# Patient Record
Sex: Male | Born: 1965
Health system: Southern US, Community
[De-identification: ages and names within clinical notes are randomized; demographics above are authoritative.]

## PROBLEM LIST (undated history)

## (undated) DIAGNOSIS — R569 Unspecified convulsions: Secondary | ICD-10-CM

## (undated) DIAGNOSIS — T7840XA Allergy, unspecified, initial encounter: Secondary | ICD-10-CM

## (undated) DIAGNOSIS — E079 Disorder of thyroid, unspecified: Secondary | ICD-10-CM

## (undated) DIAGNOSIS — Z21 Asymptomatic human immunodeficiency virus [HIV] infection status: Secondary | ICD-10-CM

## (undated) DIAGNOSIS — B20 Human immunodeficiency virus [HIV] disease: Secondary | ICD-10-CM

## (undated) DIAGNOSIS — R55 Syncope and collapse: Secondary | ICD-10-CM

## (undated) DIAGNOSIS — B019 Varicella without complication: Secondary | ICD-10-CM

## (undated) HISTORY — DX: Disorder of thyroid, unspecified: E07.9

## (undated) HISTORY — DX: Varicella without complication: B01.9

## (undated) HISTORY — PX: NO PAST SURGERIES: SHX2092

## (undated) HISTORY — DX: Allergy, unspecified, initial encounter: T78.40XA

---

## 2017-08-21 ENCOUNTER — Emergency Department (HOSPITAL_COMMUNITY): Payer: BC Managed Care – PPO

## 2017-08-21 ENCOUNTER — Observation Stay (HOSPITAL_COMMUNITY)
Admission: EM | Admit: 2017-08-21 | Discharge: 2017-08-24 | Disposition: A | Discharge status: Home / Self Care | Payer: BC Managed Care – PPO | Attending: Family Medicine | Admitting: Family Medicine

## 2017-08-21 ENCOUNTER — Encounter (HOSPITAL_COMMUNITY): Payer: Self-pay | Admitting: *Deleted

## 2017-08-21 ENCOUNTER — Other Ambulatory Visit: Payer: Self-pay

## 2017-08-21 DIAGNOSIS — R55 Syncope and collapse: Secondary | ICD-10-CM | POA: Diagnosis not present

## 2017-08-21 DIAGNOSIS — R779 Abnormality of plasma protein, unspecified: Secondary | ICD-10-CM | POA: Diagnosis not present

## 2017-08-21 DIAGNOSIS — D509 Iron deficiency anemia, unspecified: Secondary | ICD-10-CM | POA: Insufficient documentation

## 2017-08-21 DIAGNOSIS — E860 Dehydration: Secondary | ICD-10-CM | POA: Insufficient documentation

## 2017-08-21 DIAGNOSIS — X58XXXA Exposure to other specified factors, initial encounter: Secondary | ICD-10-CM | POA: Insufficient documentation

## 2017-08-21 DIAGNOSIS — R319 Hematuria, unspecified: Secondary | ICD-10-CM | POA: Insufficient documentation

## 2017-08-21 DIAGNOSIS — E038 Other specified hypothyroidism: Secondary | ICD-10-CM | POA: Insufficient documentation

## 2017-08-21 DIAGNOSIS — R32 Unspecified urinary incontinence: Secondary | ICD-10-CM | POA: Diagnosis not present

## 2017-08-21 DIAGNOSIS — D696 Thrombocytopenia, unspecified: Secondary | ICD-10-CM | POA: Diagnosis not present

## 2017-08-21 DIAGNOSIS — T675XXA Heat exhaustion, unspecified, initial encounter: Secondary | ICD-10-CM | POA: Diagnosis not present

## 2017-08-21 DIAGNOSIS — J069 Acute upper respiratory infection, unspecified: Secondary | ICD-10-CM | POA: Insufficient documentation

## 2017-08-21 DIAGNOSIS — I088 Other rheumatic multiple valve diseases: Secondary | ICD-10-CM | POA: Insufficient documentation

## 2017-08-21 DIAGNOSIS — R7989 Other specified abnormal findings of blood chemistry: Secondary | ICD-10-CM | POA: Insufficient documentation

## 2017-08-21 DIAGNOSIS — D61818 Other pancytopenia: Secondary | ICD-10-CM | POA: Insufficient documentation

## 2017-08-21 HISTORY — DX: Syncope and collapse: R55

## 2017-08-21 HISTORY — DX: Unspecified convulsions: R56.9

## 2017-08-21 LAB — CBC WITH DIFFERENTIAL/PLATELET
Basophils Absolute: 0 10*3/uL (ref 0.0–0.1)
Basophils Relative: 0 %
EOS PCT: 1 %
Eosinophils Absolute: 0.1 10*3/uL (ref 0.0–0.7)
HCT: 33.8 % — ABNORMAL LOW (ref 39.0–52.0)
HEMOGLOBIN: 11 g/dL — AB (ref 13.0–17.0)
LYMPHS PCT: 4 %
Lymphs Abs: 0.5 10*3/uL — ABNORMAL LOW (ref 0.7–4.0)
MCH: 24.9 pg — ABNORMAL LOW (ref 26.0–34.0)
MCHC: 32.5 g/dL (ref 30.0–36.0)
MCV: 76.5 fL — AB (ref 78.0–100.0)
MONO ABS: 0.5 10*3/uL (ref 0.1–1.0)
Monocytes Relative: 4 %
NEUTROS PCT: 91 %
Neutro Abs: 12 10*3/uL — ABNORMAL HIGH (ref 1.7–7.7)
PLATELETS: 82 10*3/uL — AB (ref 150–400)
RBC: 4.42 MIL/uL (ref 4.22–5.81)
RDW: 13.6 % (ref 11.5–15.5)
WBC: 13.1 10*3/uL — AB (ref 4.0–10.5)

## 2017-08-21 LAB — I-STAT TROPONIN, ED: Troponin i, poc: 0 ng/mL (ref 0.00–0.08)

## 2017-08-21 LAB — COMPREHENSIVE METABOLIC PANEL
ALT: 12 U/L — ABNORMAL LOW (ref 17–63)
ANION GAP: 7 (ref 5–15)
AST: 19 U/L (ref 15–41)
Albumin: 2.8 g/dL — ABNORMAL LOW (ref 3.5–5.0)
Alkaline Phosphatase: 40 U/L (ref 38–126)
BUN: 11 mg/dL (ref 6–20)
CHLORIDE: 105 mmol/L (ref 101–111)
CO2: 26 mmol/L (ref 22–32)
CREATININE: 1.41 mg/dL — AB (ref 0.61–1.24)
Calcium: 8.6 mg/dL — ABNORMAL LOW (ref 8.9–10.3)
GFR, EST NON AFRICAN AMERICAN: 57 mL/min — AB (ref >60)
Glucose, Bld: 108 mg/dL — ABNORMAL HIGH (ref 65–99)
Potassium: 3.7 mmol/L (ref 3.5–5.1)
SODIUM: 138 mmol/L (ref 135–145)
Total Bilirubin: 0.4 mg/dL (ref 0.3–1.2)
Total Protein: 8.3 g/dL — ABNORMAL HIGH (ref 6.5–8.1)

## 2017-08-21 LAB — URINALYSIS, ROUTINE W REFLEX MICROSCOPIC
Bilirubin Urine: NEGATIVE
GLUCOSE, UA: NEGATIVE mg/dL
Ketones, ur: NEGATIVE mg/dL
LEUKOCYTES UA: NEGATIVE
Nitrite: NEGATIVE
PROTEIN: 100 mg/dL — AB
SPECIFIC GRAVITY, URINE: 1.018 (ref 1.005–1.030)
pH: 5 (ref 5.0–8.0)

## 2017-08-21 LAB — LIPID PANEL
CHOL/HDL RATIO: 3.1 ratio
CHOLESTEROL: 92 mg/dL (ref 0–200)
HDL: 30 mg/dL — AB (ref >40)
LDL CALC: 51 mg/dL (ref 0–99)
TRIGLYCERIDES: 55 mg/dL (ref <150)
VLDL: 11 mg/dL (ref 0–40)

## 2017-08-21 LAB — RAPID URINE DRUG SCREEN, HOSP PERFORMED
AMPHETAMINES: NOT DETECTED
BENZODIAZEPINES: NOT DETECTED
Barbiturates: NOT DETECTED
Cocaine: NOT DETECTED
OPIATES: NOT DETECTED
Tetrahydrocannabinol: NOT DETECTED

## 2017-08-21 LAB — CREATININE, SERUM
Creatinine, Ser: 1.44 mg/dL — ABNORMAL HIGH (ref 0.61–1.24)
GFR calc Af Amer: >60 mL/min (ref >60)
GFR calc non Af Amer: 55 mL/min — ABNORMAL LOW (ref >60)

## 2017-08-21 LAB — IRON AND TIBC
Iron: 20 ug/dL — ABNORMAL LOW (ref 45–182)
SATURATION RATIOS: 8 % — AB (ref 17.9–39.5)
TIBC: 251 ug/dL (ref 250–450)
UIBC: 231 ug/dL

## 2017-08-21 LAB — PROCALCITONIN: Procalcitonin: 0.42 ng/mL

## 2017-08-21 LAB — TRANSFERRIN: TRANSFERRIN: 179 mg/dL — AB (ref 180–329)

## 2017-08-21 LAB — TSH: TSH: 0.254 u[IU]/mL — AB (ref 0.350–4.500)

## 2017-08-21 LAB — T4, FREE: Free T4: 0.69 ng/dL — ABNORMAL LOW (ref 0.82–1.77)

## 2017-08-21 LAB — CBG MONITORING, ED: GLUCOSE-CAPILLARY: 96 mg/dL (ref 65–99)

## 2017-08-21 LAB — MAGNESIUM: Magnesium: 1.5 mg/dL — ABNORMAL LOW (ref 1.7–2.4)

## 2017-08-21 LAB — TROPONIN I: TROPONIN I: 0.06 ng/mL — AB (ref <0.03)

## 2017-08-21 LAB — FERRITIN: Ferritin: 128 ng/mL (ref 24–336)

## 2017-08-21 LAB — D-DIMER, QUANTITATIVE (NOT AT ARMC): D DIMER QUANT: 3.1 ug{FEU}/mL — AB (ref 0.00–0.50)

## 2017-08-21 MED ORDER — ONDANSETRON HCL 4 MG/2ML IJ SOLN
4.0000 mg | Freq: Once | INTRAMUSCULAR | Status: AC
  Administered 2017-08-21: 4 mg via INTRAVENOUS
  Filled 2017-08-21: qty 2

## 2017-08-21 MED ORDER — SODIUM CHLORIDE 0.9 % IV BOLUS
1000.0000 mL | Freq: Once | INTRAVENOUS | Status: AC
  Administered 2017-08-21: 1000 mL via INTRAVENOUS

## 2017-08-21 MED ORDER — IOPAMIDOL (ISOVUE-370) INJECTION 76%
100.0000 mL | Freq: Once | INTRAVENOUS | Status: AC | PRN
  Administered 2017-08-21: 100 mL via INTRAVENOUS

## 2017-08-21 MED ORDER — IOPAMIDOL (ISOVUE-370) INJECTION 76%
INTRAVENOUS | Status: AC
  Filled 2017-08-21: qty 100

## 2017-08-21 MED ORDER — ACETAMINOPHEN 650 MG RE SUPP: 650.0000 mg | Freq: Four times a day (QID) | RECTAL | Status: DC | PRN

## 2017-08-21 MED ORDER — LEVOFLOXACIN 750 MG PO TABS
750.0000 mg | ORAL_TABLET | ORAL | Status: DC
  Administered 2017-08-21 – 2017-08-23 (×3): 750 mg via ORAL
  Filled 2017-08-21 (×3): qty 1

## 2017-08-21 MED ORDER — MAGNESIUM SULFATE 2 GM/50ML IV SOLN
2.0000 g | Freq: Once | INTRAVENOUS | Status: AC
  Administered 2017-08-21: 2 g via INTRAVENOUS
  Filled 2017-08-21: qty 50

## 2017-08-21 MED ORDER — ACETAMINOPHEN 325 MG PO TABS: 650.0000 mg | ORAL_TABLET | Freq: Four times a day (QID) | ORAL | Status: DC | PRN

## 2017-08-21 MED ORDER — ONDANSETRON HCL 4 MG PO TABS: 4.0000 mg | ORAL_TABLET | Freq: Three times a day (TID) | ORAL | Status: DC | PRN

## 2017-08-21 NOTE — H&P (Addendum)
[facility] Admission History and Physical Service Pager: [phone]  Patient name: Hunter Graves Medical record number: [id number] Date of birth: 01-07-1966 Age: 52 y.o. Gender: male  Primary Care Provider: Patient, No Pcp Per Consultants: neuro Code Status: full  Chief Complaint: syncope  Assessment and Plan: Hunter Graves is a 52 y.o. male presenting with syncope. PMH is significant for 1x potential seizure as a teenager with very little medical treatment throughout life  Syncope- infectious/vasovagal vs seizure vs arrhythmia. Prodrome favors  vasovagal versus cardiogenic.  He also have positive orthostatic vitals in ED.  Full syncope at [facility], had incontinence, question of report of convulsions but no reported post-ictal.  Patient does have hx of 1 potential seizure as a teen but was never started on meds so true seizure is not confirmed.  Positive orthostatic vitals in ED.  Patient has been coughing for ~2wk with occasional vomiting during cough.  1x blood tinged sputum reported today, no blood in vomit.  No palpitation, chest pain, SOB reported.  Patient did have prodrome prior to syncope w/ sudden feeling of light headedness/hot flash/nausea.  CXR was read as neg but there were crackles on exam.  CTA neg for PE.   Thrombocytopenic but Head CT neg for acute bleed.  Slightly anemic to 11 but unlikely enough to cause syncope.  Microcytic. S/P 1L.   -Admit to tele, Dr. Mingo Amber -consider neuro consult in AM -levoquin (5/28- ) -EEG -ECHO -am ECG -procalcitonin -TSH -lipid panel -HIV -will add O2 supp if needed but patient stable on RA currently  -UDS -He may need an event monitor before returning to his work.  He is a school bus driver  Cough: patient with productive cough for about 3 weeks.  Lung exam with crackles over right lower lobe.  He also have mild leukocytosis with left shift. Although there is no infiltration on chest x-ray/CTA chest, I think  it is reasonable to cover him for possible CAP. -We will cover him with Levaquin -Check procalcitonin  LVH- 1/6 systolic murmur- LVH on ECG, murmur on exam (neither prior known to patient).  istat trop neg in ED. -ECHO -am ECG -will consider cardiology consult based on results of further workup -trop x1  Microcytic anemia/thrombocytopenia -ferritin/iron/TIBC/transferrin -cbc in AM  Proteinemia with protein gap.  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  UA without nitrites or leukocytes -Repeat UA -May need outpatient urology follow-up if other work-ups are unrevealing  Elevated serum creatinine: unclear if this is AKI or CKD without prior serum creatinine.  Status post normal saline bolus in ED -Continue offering oral fluid -Repeat BMP  Hypomagnesemia: Mag 1.5 in ED. status post 2g -Recheck Mag   Pain: patient is currently pain-free -prn tylenol  FEN/GI: heart diet (after RN bedside), zofran  Prophylaxis: SCDs  Disposition: to tele for further workup  History of Present Illness:  Hunter Graves is a 51 y.o. male presenting with full syncope at [facility] about 10 am this morning.  He was standing up at the drink counter and suddenly felt flush/lightheaded/ and nauseous. He walked to a table and sat down.  He then woke up on the floor with people around him.  He had urinary incontinence.  There is a question of report of convulsions but no reported post-ictal.  Patient does have hx of 1 potential seizure as a teen but was never started on meds  so true seizure is not confirmed.  Patient has been coughing for ~2wk with occasional vomiting during cough.  Cough was productive with yellowish and greenish phlegm.  He says his cough was worse this morning and he noticed a blood tinge. Denies hematemesis.  Denies cold-like symptoms.  Denies fever.  Denies palpitation, chest  pain or SOB has been reported.  Denies headache, vision change, focal numbness/tingling/weakness or speech change.  He denies unintentional weight loss.  He has not been seeing a doctor regularly for most of his life.  He has no known medical conditions and excpet for an occasional tylenol does not have any chronic meds.  He does not smoke or use street drugs.  He drinks <5 alcoholic drinks per month.  He is a school bus driver.  He lives alone.  He is not aware of any medical condition that runs in the family.  Review Of Systems: Per HPI with the following additions:   Review of Systems  Constitutional: Positive for fever and weight loss.  HENT: Negative for congestion and sinus pain.   Eyes: Negative for blurred vision.  Respiratory: Positive for cough and sputum production. Negative for hemoptysis, shortness of breath and wheezing.   Cardiovascular: Negative for chest pain, palpitations and orthopnea.  Gastrointestinal: Negative for blood in stool, constipation and diarrhea.  Genitourinary: Negative for dysuria and urgency.  Neurological: Positive for seizures and loss of consciousness.  Psychiatric/Behavioral: Negative for depression.   Patient Active Problem List   Diagnosis Date Noted  . Syncope 08/21/2017    Past Medical History: History reviewed. No pertinent past medical history.  Past Surgical History: History reviewed. No pertinent surgical history.  Social History: Social History   Tobacco Use  . Smoking status: Never Smoker  . Smokeless tobacco: Never Used  Substance Use Topics  . Alcohol use: Yes    Comment: social  . Drug use: Never   Additional social history:   Please also refer to relevant sections of EMR.  Family History: History reviewed. No pertinent family history. Patient did not claim relevant family history  Allergies and Medications: No Known Allergies No current facility-administered medications on file prior to encounter.    Current  Outpatient Medications on File Prior to Encounter  Medication Sig Dispense Refill  . Dextromethorphan-guaiFENesin (ROBITUSSIN DM PO) Take 30 mLs by mouth as needed.    Marland Kitchen guaiFENesin (MUCINEX) 600 MG 12 hr tablet Take 600 mg by mouth 2 (two) times daily.      Objective: BP (!) 139/91   Pulse 98   Resp (!) 23   Ht 6' (1.829 m)   Wt 144 lb (65.3 kg)   SpO2 98%   BMI 19.53 kg/m  Exam: General: thin-looking male, NAD, on room air Eyes: clear sclera, EOMI, PERRL ENTM: MMM, no rhinorhea, no sputum production during exam, no exudates on tonsils Neck: no LAD, FROM Cardiovascular: RRR, 1/6 murmur, no edema Respiratory: crackles on lower lungs bilaterally, right > left, no IWB, good air movement Gastrointestinal: soft belly, no TTP MSK: no deficits noted Derm: no rashes noted Neuro: CN exam without deficit noted. Awake, alert and oriented appropriately. Cranial nerves II-XII  intact, motor 5/5 in all muscle groups of UE and LE bilaterally, normal tone, light sensation intact in all dermatomes of upper and lower ext bilaterally, no pronator drift, biceps and patellar reflexes 2+ bilaterally, finger to nose intact, gait deferred. Speech with mild ?expressive aphasia but he says this is his baseline Psych: AOx3 appropriate thought process.  Labs and Imaging: CBC BMET  Recent Labs  Lab 08/21/17 1250  WBC 13.1*  HGB 11.0*  HCT 33.8*  PLT 82*   Recent Labs  Lab 08/21/17 1250  NA 138  K 3.7  CL 105  CO2 26  BUN 11  CREATININE 1.41*  GLUCOSE 108*  CALCIUM 8.6*     Dg Chest 2 View  Result Date: 08/21/2017 CLINICAL DATA:  Syncopal episode and possible seizure this morning. Patient reports productive cough for the past 3-4 weeks. No current symptoms. EXAM: CHEST - 2 VIEW COMPARISON:  None in PACs FINDINGS: The lungs are adequately inflated and clear. The heart and mediastinal structures are normal. The trachea is midline. There is no pleural effusion. The bony thorax exhibits no acute  abnormality. IMPRESSION: There is no pneumonia nor other acute cardiopulmonary abnormality. Electronically Signed   By: David  Martinique M.D.   On: 08/21/2017 13:13   Ct Head Wo Contrast  Result Date: 08/21/2017 CLINICAL DATA:  Syncopal episode EXAM: CT HEAD WITHOUT CONTRAST TECHNIQUE: Contiguous axial images were obtained from the base of the skull through the vertex without intravenous contrast. COMPARISON:  None. FINDINGS: Brain: No evidence of acute infarction, hemorrhage, hydrocephalus, extra-axial collection or mass lesion/mass effect. Vascular: No hyperdense vessel or unexpected calcification. Skull: Normal. Negative for fracture or focal lesion. Sinuses/Orbits: Opacification of the maxillary sinuses is noted with partial opacification of the sphenoid sinus on the right and mucosal thickening in the ethmoid sinuses bilaterally. Other: None. IMPRESSION: No acute intracranial abnormality is noted. Diffuse sinus disease of uncertain chronicity. Electronically Signed   By: Inez Catalina M.D.   On: 08/21/2017 13:39   Ct Angio Chest Pe W/cm &/or Wo Cm  Result Date: 08/21/2017 CLINICAL DATA:  Syncopal episode today EXAM: CT ANGIOGRAPHY CHEST WITH CONTRAST TECHNIQUE: Multidetector CT imaging of the chest was performed using the standard protocol during bolus administration of intravenous contrast. Multiplanar CT image reconstructions and MIPs were obtained to evaluate the vascular anatomy. CONTRAST:  75 cc Isovue 370 COMPARISON:  Chest radiography same day FINDINGS: Cardiovascular: Pulmonary arterial opacification is good. No pulmonary emboli. No aortic atherosclerosis. No coronary artery calcification. No cardiomegaly. No pericardial fluid. Mediastinum/Nodes: No mass or lymphadenopathy Lungs/Pleura: Bronchial thickening pattern centrally and in the lower lobes that could go along with bronchitis or asthma. Upper lungs are clear. No consolidation or collapse. No pleural effusion. Upper Abdomen: Negative  Musculoskeletal: Negative Review of the MIP images confirms the above findings. IMPRESSION: No pulmonary emboli. Pattern of bronchial thickening in the perihilar regions and lower lobes that could go along with bronchitis or asthma. Electronically Signed   By: Nelson Chimes M.D.   On: 08/21/2017 16:19    Sherene Sires, DO 08/21/2017, 6:43 PM PGY-1, [place] Intern pager: [phone], text pages welcome   I have seen and evaluated the patient with Dr. Criss Rosales. I am in agreement with the note above in its revised form. My additions are in red.  Wendee Beavers, MD, PGY-3 08/21/2017 11:28 PM

## 2017-08-21 NOTE — ED Notes (Signed)
Pt returned from XR/CT scans

## 2017-08-21 NOTE — ED Provider Notes (Signed)
[facility] EMERGENCY DEPARTMENT Provider Note   CSN: [id number] Arrival date & time: 08/21/17  1053     History   Chief Complaint Chief Complaint  Patient presents with  . Loss of Consciousness    HPI MOUA RASMUSSON is a 52 y.o. male.  The history is provided by the patient. No language interpreter was used.  Loss of Consciousness     RIGLEY NIESS is a 52 y.o. male who presents to the Emergency Department complaining of syncope. His waiting in line at [facility] and had ordered his food when he began to feel nauseous. He went to sit down and then he sync up ice and fell to the ground. He did hit his face. It is unclear if he had seizure activity or not. The fall was unwitnessed and he states that the clerk told him that he did have convulsions. He states when he awoke he was aware of his surroundings but did have nausea with vomiting once. He has persistent nausea in the emergency department. He does endorse 3 to 4 weeks of cough, worse at night. He also endorses night sweats. No reports of fever, chest pain, diarrhea, leg swelling or pain. He had one seizure when he was a teenager but no additional seizures. He was not placed on seizure medications. History reviewed. No pertinent past medical history.  Patient Active Problem List   Diagnosis Date Noted  . Syncope 08/21/2017    History reviewed. No pertinent surgical history.      Home Medications    Prior to Admission medications   Medication Sig Start Date End Date Taking? Authorizing Provider  Dextromethorphan-guaiFENesin (ROBITUSSIN DM PO) Take 30 mLs by mouth as needed.   Yes [provider]  guaiFENesin (MUCINEX) 600 MG 12 hr tablet Take 600 mg by mouth 2 (two) times daily.   Yes [provider]    Family History History reviewed. No pertinent family history.  Social History Social History   Tobacco Use  . Smoking status: Never Smoker  . Smokeless tobacco: Never Used    Substance Use Topics  . Alcohol use: Yes    Comment: social  . Drug use: Never     Allergies   Patient has no known allergies.   Review of Systems Review of Systems  Cardiovascular: Positive for syncope.  All other systems reviewed and are negative.    Physical Exam Updated Vital Signs BP 134/84   Pulse 99   Resp 16   Ht 6' (1.829 m)   Wt 65.3 kg (144 lb)   SpO2 97%   BMI 19.53 kg/m   Physical Exam  Constitutional: He is oriented to person, place, and time. He appears well-developed and well-nourished.  HENT:  Head: Normocephalic and atraumatic.  Eyes: Pupils are equal, round, and reactive to light. EOM are normal.  Cardiovascular: Normal rate and regular rhythm.  No murmur heard. Pulmonary/Chest: Effort normal and breath sounds normal. No respiratory distress.  Abdominal: Soft. There is no tenderness. There is no rebound and no guarding.  Musculoskeletal: He exhibits no edema or tenderness.  Neurological: He is alert and oriented to person, place, and time.  Skin: Skin is warm and dry.  Psychiatric: He has a normal mood and affect. His behavior is normal.  Nursing note and vitals reviewed.    ED Treatments / Results  Labs (all labs ordered are listed, but only abnormal results are displayed) Labs Reviewed  COMPREHENSIVE METABOLIC PANEL - Abnormal; Notable for  the following components:      Result Value   Glucose, Bld 108 (*)    Creatinine, Ser 1.41 (*)    Calcium 8.6 (*)    Total Protein 8.3 (*)    Albumin 2.8 (*)    ALT 12 (*)    GFR calc non Af Amer 57 (*)    All other components within normal limits  CBC WITH DIFFERENTIAL/PLATELET - Abnormal; Notable for the following components:   WBC 13.1 (*)    Hemoglobin 11.0 (*)    HCT 33.8 (*)    MCV 76.5 (*)    MCH 24.9 (*)    Platelets 82 (*)    Neutro Abs 12.0 (*)    Lymphs Abs 0.5 (*)    All other components within normal limits  URINALYSIS, ROUTINE W REFLEX MICROSCOPIC - Abnormal; Notable for the  following components:   APPearance HAZY (*)    Hgb urine dipstick LARGE (*)    Protein, ur 100 (*)    RBC / HPF >50 (*)    Bacteria, UA RARE (*)    All other components within normal limits  MAGNESIUM - Abnormal; Notable for the following components:   Magnesium 1.5 (*)    All other components within normal limits  D-DIMER, QUANTITATIVE (NOT AT [facility]) - Abnormal; Notable for the following components:   D-Dimer, Quant 3.10 (*)    All other components within normal limits  URINE CULTURE  RAPID URINE DRUG SCREEN, HOSP PERFORMED  CBG MONITORING, ED  I-STAT TROPONIN, ED    EKG EKG Interpretation  Date/Time:  Tuesday Aug 21 2017 11:30:56 EDT Ventricular Rate:  106 PR Interval:    QRS Duration: 72 QT Interval:  330 QTC Calculation: 439 R Axis:   59 Text Interpretation:  Sinus tachycardia Ventricular premature complex Left ventricular hypertrophy Anterior Q waves, possibly due to LVH no prior available for comparison Confirmed by Quintella Reichert [phone]) on 08/21/2017 11:35:50 AM   Radiology Dg Chest 2 View  Result Date: 08/21/2017 CLINICAL DATA:  Syncopal episode and possible seizure this morning. Patient reports productive cough for the past 3-4 weeks. No current symptoms. EXAM: CHEST - 2 VIEW COMPARISON:  None in PACs FINDINGS: The lungs are adequately inflated and clear. The heart and mediastinal structures are normal. The trachea is midline. There is no pleural effusion. The bony thorax exhibits no acute abnormality. IMPRESSION: There is no pneumonia nor other acute cardiopulmonary abnormality. Electronically Signed   By: David  Martinique M.D.   On: 08/21/2017 13:13   Ct Head Wo Contrast  Result Date: 08/21/2017 CLINICAL DATA:  Syncopal episode EXAM: CT HEAD WITHOUT CONTRAST TECHNIQUE: Contiguous axial images were obtained from the base of the skull through the vertex without intravenous contrast. COMPARISON:  None. FINDINGS: Brain: No evidence of acute infarction, hemorrhage,  hydrocephalus, extra-axial collection or mass lesion/mass effect. Vascular: No hyperdense vessel or unexpected calcification. Skull: Normal. Negative for fracture or focal lesion. Sinuses/Orbits: Opacification of the maxillary sinuses is noted with partial opacification of the sphenoid sinus on the right and mucosal thickening in the ethmoid sinuses bilaterally. Other: None. IMPRESSION: No acute intracranial abnormality is noted. Diffuse sinus disease of uncertain chronicity. Electronically Signed   By: Inez Catalina M.D.   On: 08/21/2017 13:39   Ct Angio Chest Pe W/cm &/or Wo Cm  Result Date: 08/21/2017 CLINICAL DATA:  Syncopal episode today EXAM: CT ANGIOGRAPHY CHEST WITH CONTRAST TECHNIQUE: Multidetector CT imaging of the chest was performed using the standard protocol during bolus  administration of intravenous contrast. Multiplanar CT image reconstructions and MIPs were obtained to evaluate the vascular anatomy. CONTRAST:  75 cc Isovue 370 COMPARISON:  Chest radiography same day FINDINGS: Cardiovascular: Pulmonary arterial opacification is good. No pulmonary emboli. No aortic atherosclerosis. No coronary artery calcification. No cardiomegaly. No pericardial fluid. Mediastinum/Nodes: No mass or lymphadenopathy Lungs/Pleura: Bronchial thickening pattern centrally and in the lower lobes that could go along with bronchitis or asthma. Upper lungs are clear. No consolidation or collapse. No pleural effusion. Upper Abdomen: Negative Musculoskeletal: Negative Review of the MIP images confirms the above findings. IMPRESSION: No pulmonary emboli. Pattern of bronchial thickening in the perihilar regions and lower lobes that could go along with bronchitis or asthma. Electronically Signed   By: Nelson Chimes M.D.   On: 08/21/2017 16:19    Procedures Procedures (including critical care time)  Medications Ordered in ED Medications  iopamidol (ISOVUE-370) 76 % injection (has no administration in time range)  sodium  chloride 0.9 % bolus 1,000 mL (0 mLs Intravenous Stopped 08/21/17 1408)  ondansetron (ZOFRAN) injection 4 mg (4 mg Intravenous Given 08/21/17 1239)  magnesium sulfate IVPB 2 g 50 mL (0 g Intravenous Stopped 08/21/17 1508)  iopamidol (ISOVUE-370) 76 % injection 100 mL (100 mLs Intravenous Contrast Given 08/21/17 1603)     Initial Impression / Assessment and Plan / ED Course  I have reviewed the triage vital signs and the nursing notes.  Pertinent labs & imaging results that were available during my care of the patient were reviewed by me and considered in my medical decision making (see chart for details).     Patient here for evaluation following syncopal event with possible seizure activity. He did have nausea and tachycardia on ED arrival, these improved with IV fluids and antiemetics. Given recent URI with cough and congestion, tachycardia and possible syncope at the timer was ordered and this was elevated. CT was obtained and this is negative for PE. CT had is negative for any mass lesions. Labs do demonstrate elevation and creatinine with thrombocytopenia, unclear if this is acute or chronic. No evidence of acute infectious process based on evaluation at this time. Patient works as a Recruitment consultant. Given unclear events, syncope versus seizure recommend observation admission for further monitoring. Discussed with patient findings of studies recommendation for admission and he is in agreement with plan. Family medicine consulted for admission. Final Clinical Impressions(s) / ED Diagnoses   Final diagnoses:  Syncope and collapse    ED Discharge Orders    None       Quintella Reichert, MD 08/21/17 1722

## 2017-08-21 NOTE — ED Notes (Signed)
Patient transported to X-ray 

## 2017-08-21 NOTE — ED Notes (Signed)
Pt notified he has to provide a urine specimen when possible. Pt was given the option of condom catheter or urinal, and Pt opted to try urinal at this time.

## 2017-08-21 NOTE — ED Triage Notes (Signed)
Pt passed out while at [employer] today. Employee of sub way heard Pt fall to ground. PT reports he was sitting in a chair before he passed out. Pt reports he remembers waking up on the floor with a group of people around him. Pt reports seizure many year ago . Pt also reports a cough for a week. Pt observed to have urinary incont. With syncope.

## 2017-08-21 NOTE — Progress Notes (Signed)
Received report on pt.

## 2017-08-21 NOTE — Progress Notes (Signed)
Hunter Graves is a 52 y.o. male patient admitted from ED awake, alert - oriented  X 4 - no acute distress noted.  VSS - Blood pressure (!) 139/91, pulse 98, resp. rate (!) 23, height 6' (1.829 m), weight 65.3 kg (144 lb), SpO2 98 %.    IV in place, occlusive dsg intact without redness.  Orientation to room, and floor completed with information packet given to patient/family.  Patient declined safety video at this time.  Admission INP armband ID verified with patient/family, and in place.   SR up x 2, fall assessment complete, with patient and family able to verbalize understanding of risk associated with falls, and verbalized understanding to call nsg before up out of bed.  Call light within reach, patient able to voice, and demonstrate understanding.  Skin, clean-dry- intact without evidence of bruising, or skin tears.   No evidence of skin break down noted on exam.     Will cont to eval and treat per MD orders.  Celine Ahr, RN 08/21/2017 6:47 PM

## 2017-08-22 ENCOUNTER — Observation Stay (HOSPITAL_COMMUNITY): Payer: BC Managed Care – PPO

## 2017-08-22 DIAGNOSIS — R55 Syncope and collapse: Secondary | ICD-10-CM | POA: Diagnosis not present

## 2017-08-22 DIAGNOSIS — E038 Other specified hypothyroidism: Secondary | ICD-10-CM | POA: Diagnosis not present

## 2017-08-22 DIAGNOSIS — T675XXA Heat exhaustion, unspecified, initial encounter: Secondary | ICD-10-CM | POA: Diagnosis not present

## 2017-08-22 DIAGNOSIS — D61818 Other pancytopenia: Secondary | ICD-10-CM | POA: Diagnosis not present

## 2017-08-22 LAB — BASIC METABOLIC PANEL
ANION GAP: 4 — AB (ref 5–15)
BUN: 11 mg/dL (ref 6–20)
CALCIUM: 8.2 mg/dL — AB (ref 8.9–10.3)
CO2: 26 mmol/L (ref 22–32)
Chloride: 107 mmol/L (ref 101–111)
Creatinine, Ser: 1.44 mg/dL — ABNORMAL HIGH (ref 0.61–1.24)
GFR calc Af Amer: >60 mL/min (ref >60)
GFR calc non Af Amer: 55 mL/min — ABNORMAL LOW (ref >60)
GLUCOSE: 100 mg/dL — AB (ref 65–99)
Potassium: 4.1 mmol/L (ref 3.5–5.1)
Sodium: 137 mmol/L (ref 135–145)

## 2017-08-22 LAB — CBC
HEMATOCRIT: 29.7 % — AB (ref 39.0–52.0)
Hemoglobin: 9.6 g/dL — ABNORMAL LOW (ref 13.0–17.0)
MCH: 25 pg — AB (ref 26.0–34.0)
MCHC: 32.3 g/dL (ref 30.0–36.0)
MCV: 77.3 fL — AB (ref 78.0–100.0)
Platelets: 80 10*3/uL — ABNORMAL LOW (ref 150–400)
RBC: 3.84 MIL/uL — ABNORMAL LOW (ref 4.22–5.81)
RDW: 14 % (ref 11.5–15.5)
WBC: 3.9 10*3/uL — AB (ref 4.0–10.5)

## 2017-08-22 LAB — PROCALCITONIN: Procalcitonin: 0.37 ng/mL

## 2017-08-22 LAB — MAGNESIUM: Magnesium: 1.9 mg/dL (ref 1.7–2.4)

## 2017-08-22 LAB — OCCULT BLOOD X 1 CARD TO LAB, STOOL: FECAL OCCULT BLD: NEGATIVE

## 2017-08-22 LAB — TROPONIN I: Troponin I: <0.03 ng/mL (ref <0.03)

## 2017-08-22 MED ORDER — GADOBENATE DIMEGLUMINE 529 MG/ML IV SOLN
7.0000 mL | Freq: Once | INTRAVENOUS | Status: AC | PRN
  Administered 2017-08-22: 7 mL via INTRAVENOUS

## 2017-08-22 NOTE — Procedures (Signed)
ELECTROENCEPHALOGRAM REPORT  Date of Study: 08/22/2017  Patient's Name: KAISER BELLUOMINI MRN: [id number] Date of Birth: March 17, 1966  Referring Provider: Dr. Esmond Camper  Clinical History: This is a 52 year old man with loss of consciousness with shaking reported.  Medications: No AEDs or sedation listed  Technical Summary: A multichannel digital EEG recording measured by the international 10-20 system with electrodes applied with paste and impedances below 5000 ohms performed in our laboratory with EKG monitoring in an awake and drowsy patient.  Hyperventilation and photic stimulation were performed.  The digital EEG was referentially recorded, reformatted, and digitally filtered in a variety of bipolar and referential montages for optimal display.    Description: The patient is awake and drowsy during the recording.  During maximal wakefulness, there is a symmetric, medium voltage 10.5 Hz posterior dominant rhythm that attenuates with eye opening.  The record is symmetric.  During drowsiness, there is an increase in theta slowing of the background.  Deeper stages of sleep were not captured. Hyperventilation and photic stimulation did not elicit any abnormalities.  There were no epileptiform discharges or electrographic seizures seen.    EKG lead was unremarkable.  Impression: This awake and drowsy EEG is normal.    Clinical Correlation: A normal EEG does not exclude a clinical diagnosis of epilepsy. Clinical correlation is advised.   Ellouise Newer, M.D.

## 2017-08-22 NOTE — Progress Notes (Signed)
Family Medicine Teaching Service Daily Progress Note Intern Pager: [phone]  Patient name: Hunter Graves Medical record number: [id number] Date of birth: 25-Aug-1965 Age: 52 y.o. Gender: male  Primary Care Provider: Patient, No Pcp Per Consultants: none Code Status: full  Pt Overview and Major Events to Date:  5/28 admitted to fpts 5/29 MRI brain, echo, EEG ordered  Assessment and Plan: Hunter Graves is a 52 y.o. male presenting with syncope. PMH is significant for 1x potential seizure as a teenager with very little medical treatment throughout life  Syncope Broad differential at this point. Patient with signs and symptoms consistent with vasovagal although there is some concern for seizure based on his urinary incontinence and muscle jerking. Patient recovering from recent illness and [place] standing [place] hot subway [place] getting [place] of a hot bus. Positive orthostatics in ED. Remaining elements of syncope workup are EEG, and echo. Patient with likely upper respiratory infection which could be contributory, currently on levaquin. - continue telemetry - EEG, Echo - continue levoquin (5/28-) - possibly discuss with cardiology regarding need for event monitor if above is negative  Hypothyroidism TSH low at 0.254, T4 low at 0.69. Would expect TSH to be high if related to thyroid dysfunction. Patient not making TSH which implies a problem higher on axis. Will get brain MRI to rule out any structural cause for this deficiency especially in pituitary region. - brain MRI  Pancytopenia  proteinemia w/ protein gap Patient with wbc 4, hgb 9.6, plt 80. Unclear etiology for pancytopenia. Not likely to be dilutional as taken before significant fluid givn. Also has history of small hematuria. Couple with elevated protein and low albumin, will follow up SPEP and UPEP to rule out monoclonal malignancy. Additional workup pending those results. Patient with findings consistent with Iron deficiency  anemia. Could likely use colonscopy as outpatient. Iron supplementation. - F/U SPEP - F/U UPEP - colonscopy as outpatient - Iron sulfate 325mg  daily  Cough Patient with bibasilar crackles on exam. Currently on levoquin. Procalcitonin negative. Will continue to cover given length of symptoms and especially since it may be contributory to syncope. D-Dimer mildly elevated, CTA ruled out PE.  LVH 1/6 systolic murmur appreciated on exam at admission. Appears to not be present this morning. Troponin 0.06 on presentation, down to <0.03 on recheck. Echo pending. - f/u echo  Hematuria: denies urinary symptoms.  UA without nitrites or leukocytes -Repeat UA -May need outpatient urology follow-up if other work-ups are unrevealing  Elevated serum creatinine: unclear if this is AKI or CKD without prior serum creatinine.  Status post normal saline bolus in ED -Continue offering oral fluid -Repeat BMP  Hypomagnesemia: Mag 1.5 in ED. status post 2g. UP to 1.9 this am.  Pain: patient is currently pain-free -prn tylenol  FEN/GI: regular PPx: SCDs  Disposition: likely home  Subjective:  Doing well this morning. Resting comfortably. Tolerating food.  Objective: Temp:  [98.2 F (36.8 C)] 98.2 F (36.8 C) (05/28 1847) Pulse Rate:  [72-118] 72 (05/29 0605) Resp:  [16-28] 17 (05/29 0605) BP: (101-151)/(74-95) 119/79 (05/29 0605) SpO2:  [96 %-100 %] 96 % (05/29 2992) Physical Exam: General: thin, african [place] male,  Resting comfortably in bed Eyes: eomi, perrl Cardiovascular: Regular Rate and Rhythm, no murmur appreciated on exam Respiratory: crackles in BLL, Right>left, good air movement Gastrointestinal: non-tender, non-distended, soft Neuro: cn 2-12 intact, no focal neuro deficits  Laboratory: Recent Labs  Lab 08/21/17 1250 08/22/17 0242  WBC 13.1* 3.9*  HGB 11.0*  9.6*  HCT 33.8* 29.7*  PLT 82* 80*   Recent Labs  Lab 08/21/17 1250 08/21/17 1904 08/22/17 0242  NA 138   --  137  K 3.7  --  4.1  CL 105  --  107  CO2 26  --  26  BUN 11  --  11  CREATININE 1.41* 1.44* 1.44*  CALCIUM 8.6*  --  8.2*  PROT 8.3*  --   --   BILITOT 0.4  --   --   ALKPHOS 40  --   --   ALT 12*  --   --   AST 19  --   --   GLUCOSE 108*  --  100*    Imaging/Diagnostic Tests: CLINICAL DATA:  Syncopal episode today  EXAM: CT ANGIOGRAPHY CHEST WITH CONTRAST  TECHNIQUE: Multidetector CT imaging of the chest was performed using the standard protocol during bolus administration of intravenous contrast. Multiplanar CT image reconstructions and MIPs were obtained to evaluate the vascular anatomy.  CONTRAST:  75 cc Isovue 370  COMPARISON:  Chest radiography same day  FINDINGS: Cardiovascular: Pulmonary arterial opacification is good. No pulmonary emboli. No aortic atherosclerosis. No coronary artery calcification. No cardiomegaly. No pericardial fluid.  Mediastinum/Nodes: No mass or lymphadenopathy  Lungs/Pleura: Bronchial thickening pattern centrally and in the lower lobes that could go along with bronchitis or asthma. Upper lungs are clear. No consolidation or collapse. No pleural effusion.  Upper Abdomen: Negative  Musculoskeletal: Negative  Review of the MIP images confirms the above findings.  IMPRESSION: No pulmonary emboli.  Pattern of bronchial thickening in the perihilar regions and lower lobes that could go along with bronchitis or asthma.   Electronically Signed   By: Nelson Chimes M.D.   On: 08/21/2017 16:19  Guadalupe Dawn, MD 08/22/2017, 11:41 AM PGY-1, [place] Intern pager: [phone], text pages welcome

## 2017-08-22 NOTE — Procedures (Signed)
echo not attempted today due to high volume of echo orders.

## 2017-08-22 NOTE — Progress Notes (Signed)
EEG Completed; Results Pending  

## 2017-08-23 ENCOUNTER — Other Ambulatory Visit (HOSPITAL_COMMUNITY): Payer: BC Managed Care – PPO

## 2017-08-23 DIAGNOSIS — R7989 Other specified abnormal findings of blood chemistry: Secondary | ICD-10-CM | POA: Diagnosis not present

## 2017-08-23 DIAGNOSIS — D509 Iron deficiency anemia, unspecified: Secondary | ICD-10-CM | POA: Diagnosis not present

## 2017-08-23 DIAGNOSIS — R55 Syncope and collapse: Secondary | ICD-10-CM | POA: Diagnosis not present

## 2017-08-23 LAB — CBC WITH DIFFERENTIAL/PLATELET
ABS IMMATURE GRANULOCYTES: 0 10*3/uL (ref 0.0–0.1)
BASOS ABS: 0 10*3/uL (ref 0.0–0.1)
Basophils Relative: 0 %
EOS PCT: 5 %
Eosinophils Absolute: 0.2 10*3/uL (ref 0.0–0.7)
HCT: 32.6 % — ABNORMAL LOW (ref 39.0–52.0)
HEMOGLOBIN: 10.4 g/dL — AB (ref 13.0–17.0)
IMMATURE GRANULOCYTES: 1 %
LYMPHS ABS: 1 10*3/uL (ref 0.7–4.0)
LYMPHS PCT: 23 %
MCH: 24.8 pg — ABNORMAL LOW (ref 26.0–34.0)
MCHC: 31.9 g/dL (ref 30.0–36.0)
MCV: 77.6 fL — ABNORMAL LOW (ref 78.0–100.0)
Monocytes Absolute: 0.4 10*3/uL (ref 0.1–1.0)
Monocytes Relative: 9 %
NEUTROS ABS: 2.7 10*3/uL (ref 1.7–7.7)
NEUTROS PCT: 62 %
Platelets: 84 10*3/uL — ABNORMAL LOW (ref 150–400)
RBC: 4.2 MIL/uL — AB (ref 4.22–5.81)
RDW: 14.1 % (ref 11.5–15.5)
WBC: 4.4 10*3/uL (ref 4.0–10.5)

## 2017-08-23 LAB — BASIC METABOLIC PANEL
ANION GAP: 3 — AB (ref 5–15)
BUN: 14 mg/dL (ref 6–20)
CALCIUM: 8.5 mg/dL — AB (ref 8.9–10.3)
CHLORIDE: 107 mmol/L (ref 101–111)
CO2: 26 mmol/L (ref 22–32)
CREATININE: 1.4 mg/dL — AB (ref 0.61–1.24)
GFR calc non Af Amer: 57 mL/min — ABNORMAL LOW (ref >60)
Glucose, Bld: 100 mg/dL — ABNORMAL HIGH (ref 65–99)
Potassium: 4 mmol/L (ref 3.5–5.1)
SODIUM: 136 mmol/L (ref 135–145)

## 2017-08-23 LAB — HIV 1/2 AB DIFFERENTIATION
HIV 1 AB: POSITIVE — AB
HIV 2 AB: NEGATIVE

## 2017-08-23 LAB — T3: T3 TOTAL: 86 ng/dL (ref 71–180)

## 2017-08-23 LAB — HIV ANTIBODY (ROUTINE TESTING W REFLEX): HIV SCREEN 4TH GENERATION: REACTIVE — AB

## 2017-08-23 LAB — PROCALCITONIN: PROCALCITONIN: 0.2 ng/mL

## 2017-08-23 MED ORDER — FERROUS SULFATE 325 (65 FE) MG PO TABS
325.0000 mg | ORAL_TABLET | Freq: Every day | ORAL | Status: DC
  Administered 2017-08-23 – 2017-08-24 (×2): 325 mg via ORAL
  Filled 2017-08-23 (×2): qty 1

## 2017-08-23 MED ORDER — COSYNTROPIN 0.25 MG IJ SOLR
0.2500 mg | Freq: Once | INTRAMUSCULAR | Status: AC
  Administered 2017-08-24: 0.25 mg via INTRAVENOUS
  Filled 2017-08-23: qty 0.25

## 2017-08-23 NOTE — Progress Notes (Signed)
Family Medicine Teaching Service Daily Progress Note Intern Pager: [phone]  Patient name: Hunter Graves Medical record number: [id number] Date of birth: 13-Feb-1966 Age: 52 y.o. Gender: male  Primary Care Provider: Midge Minium, MD Consultants: none Code Status: full  Pt Overview and Major Events to Date:  5/28 admitted to fpts 5/29 MRI brain, echo, EEG ordered  Assessment and Plan: Hunter Graves is a 52 y.o. male presenting with syncope. PMH is significant for 1x potential seizure as a teenager with very little medical treatment throughout life  Syncope Patient with MRI brain negative for any acute abnormal findings. EEG negative. Per patient report when he had blood drawn this am, he developed very similar symptoms to his syncope. Leading differential for syncope is vasovagal at this point. Still needs echo, repeat orthostatics. URI symptoms could be contributory, continuing levoquin.  - continue telemetry - F/U echo - continue levoquin (5/28-) - f/u ortostatics  Hypothyroidism TSH low at 0.254, T4 low at 0.69. Would expect TSH to be high if related to thyroid dysfunction. 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 Per current guidelines it appears to incidental PAs of <46mm do not warrant further imaging or followup. Patient does not exactly have incidental finding since the imaging was specifically looking for pituitary adenomas. Will draw ACTH level in am to assess cortisol function. If comes back normal will send patient home with thyroid hormone supplementation. - ACTH level in AM - if normal, can send patient home with synthroid  Pancytopenia  proteinemia w/ protein gap Patient with wbc 4.4 up from 3.9, hgb 10.4 from 9.6, plt 84 from 80. Unclear etiology for pancytopenia although this does seem to be improving somewhat. History of small hematuria. Coupled with elevated protein and low albumin, will follow up SPEP and UPEP to rule out monoclonal  malignancy. Additional workup pending those results. Patient with findings consistent with Iron deficiency anemia. Could likely use colonscopy as outpatient. Iron supplementation. - F/U SPEP - F/U UPEP - colonscopy as outpatient - Iron sulfate 325mg  daily  Cough Patient with bibasilar crackles on exam although improved on exam this am. Currently on levoquin. Procalcitonin negative. Will continue to cover given length of symptoms and especially since it may be contributory to syncope. D-Dimer mildly elevated, CTA ruled out PE.  LVH 1/6 systolic murmur appreciated on exam at admission. Appears to not be present this morning. Troponin 0.06 on presentation, down to <0.03 on recheck. Echo pending. - f/u echo  Hematuria: denies urinary symptoms.  UA without nitrites or leukocytes -Repeat UA -May need outpatient urology follow-up if other work-ups are unrevealing  Elevated serum creatinine: unclear if this is AKI or CKD without prior serum creatinine.  Status post normal saline bolus in ED -Continue offering oral fluid -Repeat BMP  Hypomagnesemia: Mag 1.5 in ED. status post 2g. 1.9 on 5/29  Pain: patient is currently pain-free -prn tylenol  FEN/GI: regular PPx: SCDs  Disposition: likely home  Subjective:  Doing well this morning with no complaints. Some diarrhea after receiving contrast for MRI overnight, otherwise no issues.  Objective: Temp:  [97.5 F (36.4 C)-98.1 F (36.7 C)] 97.5 F (36.4 C) (05/30 1250) Pulse Rate:  [75-88] 88 (05/30 1250) Resp:  [14-18] 18 (05/30 1250) BP: (118-122)/(76-84) 122/76 (05/30 1250) SpO2:  [98 %-100 %] 100 % (05/30 1250) Physical Exam: General: thin, african [place] male,  Resting comfortably in bed Eyes: eomi, perrl Cardiovascular: rrr, no m/r/g Respiratory: improving crackles in BLL, Right>left, good air movement Gastrointestinal:  non-tender, non-distended, soft Neuro: cn 2-12 intact, no focal neuro deficits  Laboratory: Recent  Labs  Lab 08/21/17 1250 08/22/17 0242 08/23/17 0645  WBC 13.1* 3.9* 4.4  HGB 11.0* 9.6* 10.4*  HCT 33.8* 29.7* 32.6*  PLT 82* 80* 84*   Recent Labs  Lab 08/21/17 1250 08/21/17 1904 08/22/17 0242 08/23/17 0645  NA 138  --  137 136  K 3.7  --  4.1 4.0  CL 105  --  107 107  CO2 26  --  26 26  BUN 11  --  11 14  CREATININE 1.41* 1.44* 1.44* 1.40*  CALCIUM 8.6*  --  8.2* 8.5*  PROT 8.3*  --   --   --   BILITOT 0.4  --   --   --   ALKPHOS 40  --   --   --   ALT 12*  --   --   --   AST 19  --   --   --   GLUCOSE 108*  --  100* 100*    Imaging/Diagnostic Tests: CLINICAL DATA:  Syncopal episode today  EXAM: CT ANGIOGRAPHY CHEST WITH CONTRAST  TECHNIQUE: Multidetector CT imaging of the chest was performed using the standard protocol during bolus administration of intravenous contrast. Multiplanar CT image reconstructions and MIPs were obtained to evaluate the vascular anatomy.  CONTRAST:  75 cc Isovue 370  COMPARISON:  Chest radiography same day  FINDINGS: Cardiovascular: Pulmonary arterial opacification is good. No pulmonary emboli. No aortic atherosclerosis. No coronary artery calcification. No cardiomegaly. No pericardial fluid.  Mediastinum/Nodes: No mass or lymphadenopathy  Lungs/Pleura: Bronchial thickening pattern centrally and in the lower lobes that could go along with bronchitis or asthma. Upper lungs are clear. No consolidation or collapse. No pleural effusion.  Upper Abdomen: Negative  Musculoskeletal: Negative  Review of the MIP images confirms the above findings.  IMPRESSION: No pulmonary emboli.  Pattern of bronchial thickening in the perihilar regions and lower lobes that could go along with bronchitis or asthma.   Electronically Signed   By: Nelson Chimes M.D.   On: 08/21/2017 16:19  Guadalupe Dawn, MD 08/23/2017, 1:32 PM PGY-1, [place] Intern pager: [phone], text pages welcome

## 2017-08-23 NOTE — Progress Notes (Signed)
FMTS Attending Daily Note:  S:  Awake and feels much better today.  Hasn't walked around room yet  O: Gen:  Alert, cooperative patient who appears stated age in no acute distress.  Vital signs reviewed. HEENT: MMM Cardiac:  Regular rate and rhythm without murmur auscultated.  Good S1/S2. Pulm:  Clear to auscultation bilaterally with good air movement.  No wheezes or rales noted.   Abd:  Thin/soft/NT  Imp/Plan: 1. Syncope: - doesn't appear to be cardiac in origin.  Orthostatic in ED.  With typical vasovagal prodrome -- happened again today with blood draw while lying in bed, though not complete syncope/LOS - Echo pending.  EEG negative  2.  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  Though this is very small. - ACTH stim test tomorrow AM at 8 AM - start levothyroxine after ACTH.  May need endocrine input.  - possibly contributing to #1 above, esp if any adrenal insufficiency   3.  Pancytopenia: - improving.  Platelets still low - may need SPEP/UPEP outpt pending ACTH testing  Alveda Reasons, MD 08/23/2017 1:24 PM

## 2017-08-23 NOTE — Care Management Note (Addendum)
Case Management Note  Patient Details  Name: Hunter Graves MRN: [id number] Date of Birth: 1966-02-20  Subjective/Objective:        Presented with syncope.PMH  significant for1x potential seizure as a teenager. From home alone. Independent with ADL's, no DME usage.   [address] Erline Hau (Mother)    [phone]                 PCP: Dr.Katherine Birdie Riddle  Action/Plan: Transition to home. Pt with hospital f/u scheduled with Dr. Birdie Riddle on 08/30/2017 @ 1:45 pm. Pt with transportation to home.  Expected Discharge Date:   08/23/2017           Expected Discharge Plan:  Home/Self Care  In-House Referral:     Discharge planning Services  CM Consult  Post Acute Care Choice:    Choice offered to:     DME Arranged:   n/a DME Agency:   n/a  HH Arranged:   n/a HH Agency:   n/a  Status of Service:  Completed, signed off  If discussed at [place] of Stay Meetings, dates discussed:    Additional Comments:  Sharin Mons, RN 08/23/2017, 11:13 AM

## 2017-08-24 ENCOUNTER — Observation Stay (HOSPITAL_BASED_OUTPATIENT_CLINIC_OR_DEPARTMENT_OTHER): Payer: BC Managed Care – PPO

## 2017-08-24 DIAGNOSIS — D72810 Lymphocytopenia: Secondary | ICD-10-CM | POA: Diagnosis not present

## 2017-08-24 DIAGNOSIS — N182 Chronic kidney disease, stage 2 (mild): Secondary | ICD-10-CM | POA: Diagnosis not present

## 2017-08-24 DIAGNOSIS — B20 Human immunodeficiency virus [HIV] disease: Secondary | ICD-10-CM | POA: Diagnosis not present

## 2017-08-24 DIAGNOSIS — I361 Nonrheumatic tricuspid (valve) insufficiency: Secondary | ICD-10-CM

## 2017-08-24 DIAGNOSIS — R7989 Other specified abnormal findings of blood chemistry: Secondary | ICD-10-CM | POA: Diagnosis not present

## 2017-08-24 DIAGNOSIS — D509 Iron deficiency anemia, unspecified: Secondary | ICD-10-CM | POA: Diagnosis not present

## 2017-08-24 DIAGNOSIS — D631 Anemia in chronic kidney disease: Secondary | ICD-10-CM | POA: Diagnosis not present

## 2017-08-24 DIAGNOSIS — D696 Thrombocytopenia, unspecified: Secondary | ICD-10-CM | POA: Diagnosis not present

## 2017-08-24 DIAGNOSIS — R55 Syncope and collapse: Secondary | ICD-10-CM | POA: Diagnosis not present

## 2017-08-24 LAB — HEPATIC FUNCTION PANEL
ALT: 12 U/L — AB (ref 17–63)
AST: 18 U/L (ref 15–41)
Albumin: 3.2 g/dL — ABNORMAL LOW (ref 3.5–5.0)
Alkaline Phosphatase: 47 U/L (ref 38–126)
TOTAL PROTEIN: 9.4 g/dL — AB (ref 6.5–8.1)
Total Bilirubin: 0.5 mg/dL (ref 0.3–1.2)

## 2017-08-24 LAB — PROTEIN ELECTROPHORESIS, SERUM
A/G Ratio: 0.6 — ABNORMAL LOW (ref 0.7–1.7)
ALPHA-2-GLOBULIN: 0.9 g/dL (ref 0.4–1.0)
Albumin ELP: 3 g/dL (ref 2.9–4.4)
Alpha-1-Globulin: 0.3 g/dL (ref 0.0–0.4)
Beta Globulin: 1.4 g/dL — ABNORMAL HIGH (ref 0.7–1.3)
GAMMA GLOBULIN: 2.4 g/dL — AB (ref 0.4–1.8)
Globulin, Total: 4.9 g/dL — ABNORMAL HIGH (ref 2.2–3.9)
Total Protein ELP: 7.9 g/dL (ref 6.0–8.5)

## 2017-08-24 LAB — UPEP/UIFE/LIGHT CHAINS/TP, 24-HR UR
% BETA, URINE: 17 %
ALPHA 1 URINE: 5.1 %
ALPHA 2 UR: 8 %
Albumin, U: 45.1 %
FREE KAPPA LT CHAINS, UR: 436 mg/L — AB (ref 1.35–24.19)
FREE KAPPA/LAMBDA RATIO: 19.55 — AB (ref 2.04–10.37)
Free Lambda Lt Chains,Ur: 22.3 mg/L — ABNORMAL HIGH (ref 0.24–6.66)
GAMMA GLOBULIN URINE: 24.8 %
TOTAL PROTEIN, URINE-UPE24: 29.3 mg/dL
Total Protein, Urine-Ur/day: 234 mg/24 hr — ABNORMAL HIGH (ref 30–150)
Total Volume: 800

## 2017-08-24 LAB — CBC WITH DIFFERENTIAL/PLATELET
ABS IMMATURE GRANULOCYTES: 0 10*3/uL (ref 0.0–0.1)
BASOS ABS: 0 10*3/uL (ref 0.0–0.1)
Basophils Relative: 1 %
Eosinophils Absolute: 0.3 10*3/uL (ref 0.0–0.7)
Eosinophils Relative: 9 %
HCT: 34.9 % — ABNORMAL LOW (ref 39.0–52.0)
HEMOGLOBIN: 11.2 g/dL — AB (ref 13.0–17.0)
IMMATURE GRANULOCYTES: 1 %
LYMPHS PCT: 28 %
Lymphs Abs: 1 10*3/uL (ref 0.7–4.0)
MCH: 24.7 pg — ABNORMAL LOW (ref 26.0–34.0)
MCHC: 32.1 g/dL (ref 30.0–36.0)
MCV: 76.9 fL — AB (ref 78.0–100.0)
MONO ABS: 0.4 10*3/uL (ref 0.1–1.0)
MONOS PCT: 10 %
NEUTROS ABS: 1.9 10*3/uL (ref 1.7–7.7)
NEUTROS PCT: 51 %
Platelets: 85 10*3/uL — ABNORMAL LOW (ref 150–400)
RBC: 4.54 MIL/uL (ref 4.22–5.81)
RDW: 14.1 % (ref 11.5–15.5)
WBC: 3.6 10*3/uL — ABNORMAL LOW (ref 4.0–10.5)

## 2017-08-24 LAB — CREATININE, SERUM
CREATININE: 1.5 mg/dL — AB (ref 0.61–1.24)
GFR calc non Af Amer: 53 mL/min — ABNORMAL LOW (ref >60)

## 2017-08-24 LAB — ACTH STIMULATION, 3 TIME POINTS
Cortisol, 30 Min: 18.8 ug/dL
Cortisol, 60 Min: 21 ug/dL
Cortisol, Base: 13.2 ug/dL

## 2017-08-24 LAB — URINE CULTURE

## 2017-08-24 LAB — ECHOCARDIOGRAM COMPLETE
Height: 72 in
WEIGHTICAEL: 2304 [oz_av]

## 2017-08-24 LAB — CORTISOL-AM, BLOOD: CORTISOL - AM: 13.6 ug/dL (ref 6.7–22.6)

## 2017-08-24 MED ORDER — SULFAMETHOXAZOLE-TRIMETHOPRIM 800-160 MG PO TABS
1.0000 | ORAL_TABLET | Freq: Every day | ORAL | Status: DC
  Administered 2017-08-24: 1 via ORAL
  Filled 2017-08-24: qty 1

## 2017-08-24 MED ORDER — BICTEGRAVIR-EMTRICITAB-TENOFOV 50-200-25 MG PO TABS
1.0000 | ORAL_TABLET | Freq: Every day | ORAL | Status: DC
  Administered 2017-08-24: 1 via ORAL
  Filled 2017-08-24 (×2): qty 1

## 2017-08-24 MED ORDER — BICTEGRAVIR-EMTRICITAB-TENOFOV 50-200-25 MG PO TABS: 1.0000 | ORAL_TABLET | Freq: Every day | ORAL | 0 refills | Status: DC

## 2017-08-24 MED ORDER — LEVOTHYROXINE SODIUM 50 MCG PO TABS: 50.0000 ug | ORAL_TABLET | Freq: Every day | ORAL | Status: DC

## 2017-08-24 MED ORDER — SULFAMETHOXAZOLE-TRIMETHOPRIM 800-160 MG PO TABS
1.0000 | ORAL_TABLET | Freq: Every day | ORAL | 0 refills | Status: DC
Start: 2017-08-24 — End: 2017-09-14

## 2017-08-24 MED ORDER — FERROUS SULFATE 325 (65 FE) MG PO TABS: 325.0000 mg | ORAL_TABLET | Freq: Every day | ORAL | 0 refills | Status: DC

## 2017-08-24 MED ORDER — LEVOFLOXACIN 750 MG PO TABS: 750.0000 mg | ORAL_TABLET | ORAL | 0 refills | Status: DC

## 2017-08-24 MED ORDER — LEVOTHYROXINE SODIUM 50 MCG PO TABS: 50.0000 ug | ORAL_TABLET | Freq: Every day | ORAL | 0 refills | Status: DC

## 2017-08-24 NOTE — Discharge Instructions (Signed)
°  He has appt with [facility], dr snider, on Wednesday 6/5 at 2:30pm. At [facility] [address], phone [phone].

## 2017-08-24 NOTE — Plan of Care (Signed)
Pt understands to follow up as directed and take all meds as prescribed.

## 2017-08-24 NOTE — Progress Notes (Signed)
Nsg Discharge Note  Admit Date:  08/21/2017 Discharge date: 08/24/2017   Hunter Graves to be D/C'd Home per MD order.  AVS completed.  Copy for chart, and copy for patient signed, and dated. Patient/caregiver able to verbalize understanding.  Discharge Medication: Allergies as of 08/24/2017   No Known Allergies     Medication List    STOP taking these medications   MUCINEX 600 MG 12 hr tablet Generic drug:  guaiFENesin   ROBITUSSIN DM PO     TAKE these medications   bictegravir-emtricitabine-tenofovir AF 50-200-25 MG Tabs tablet Commonly known as:  BIKTARVY Take 1 tablet by mouth daily.   ferrous sulfate 325 (65 FE) MG tablet Take 1 tablet (325 mg total) by mouth daily with breakfast. Start taking on:  08/25/2017   levothyroxine 50 MCG tablet Commonly known as:  SYNTHROID, LEVOTHROID Take 1 tablet (50 mcg total) by mouth daily before breakfast. Start taking on:  08/25/2017   sulfamethoxazole-trimethoprim 800-160 MG tablet Commonly known as:  BACTRIM DS,SEPTRA DS Take 1 tablet by mouth daily.       Discharge Assessment: Vitals:   08/24/17 0537 08/24/17 1547  BP: 127/85 (!) 127/112  Pulse: 73 (!) 102  Resp: 16 20  Temp: (!) 97.5 F (36.4 C) 98.5 F (36.9 C)  SpO2: 98% 92%   Skin clean, dry and intact without evidence of skin break down, no evidence of skin tears noted. IV catheter discontinued intact. Site without signs and symptoms of complications - no redness or edema noted at insertion site, patient denies c/o pain - only slight tenderness at site.  Dressing with slight pressure applied.  D/c Instructions-Education: Discharge instructions given to patient with verbalized understanding. D/c education completed with patient including follow up instructions, medication list, d/c activities limitations if indicated, with other d/c instructions as indicated by MD - patient able to verbalize understanding, all questions fully answered. Patient instructed to return  to ED, call 911, or call MD for any changes in condition.  Patient escorted via [place], and D/C home via private auto.  Hassan Rowan, RN 08/24/2017 5:35 PM

## 2017-08-24 NOTE — Progress Notes (Signed)
  Echocardiogram 2D Echocardiogram has been performed.  Valgene Deloatch G Brayson Livesey 08/24/2017, 3:57 PM

## 2017-08-24 NOTE — Progress Notes (Addendum)
Addendum to daily progress note  Discussed HIV diagnosis with patient. Informed him of ways that people typically get HIV. Never been screened before. Discussed with infectious disease who will see him in consultation later today. Appreciate their assistance. All questions answered. Ok to discuss diagnosis around family per patient.  Reviewed results of cortisol test with patient. Echo still pending.  Guadalupe Dawn MD PGY-1 Family Medicine Resident

## 2017-08-24 NOTE — Progress Notes (Signed)
Patient has positive HIV test. Will see him formally and start further testing  Domnick Chervenak [facility] [phone]

## 2017-08-24 NOTE — Consult Note (Signed)
[facility] for Infectious Disease   Reason for Consult: newly diagnosed hiv disease    Referring Physician: fletcher/walden  Principal Problem:   Syncope Active Problems:   Thrombocytopenia (HCC)   Microcytic anemia   Elevated serum creatinine   Hypomagnesemia    HPI: Hunter Graves is a 52 y.o. 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 During routine screening, his hiv test was found to be positive. He has not previously tested for HIV prior to now. His risk factor for HIV include MSM but not had any sexual relations in 3 yrs, not currently in any relationship. Denies IVDU. No hx of STI.  He is processing his new diagnosis of hiv disease. He was started on levofloxacin for   Past Medical History:  Diagnosis Date  . Seizures ([facility])    "think I had my 1st today; they are still not sure" (08/21/2017)  . Syncope and collapse    "think I had my 1st today; they are still not sure" (08/21/2017)    Allergies: No Known Allergies   MEDICATIONS: . ferrous sulfate  325 mg Oral Q breakfast  . levofloxacin  750 mg Oral Q24H  . [START ON 08/25/2017] levothyroxine  50 mcg Oral QAC breakfast    Social History   Tobacco Use  . Smoking status: Never Smoker  . Smokeless tobacco: Never Used  Substance Use Topics  . Alcohol use: Yes    Comment: 08/21/2017 "1 drink q 6 months or less"  . Drug use: Never    History reviewed. No pertinent family history.   Review of Systems  Constitutional: positive for nightsweat x 2 months. Negative for fever, chills, diaphoresis, activity change, appetite change, fatigue and unexpected weight change.  HENT: Negative for congestion, sore throat, rhinorrhea, sneezing, trouble swallowing and sinus pressure.  Eyes: Negative for photophobia and visual disturbance.  Respiratory: Negative for cough, chest tightness, shortness of breath, wheezing and stridor.    Cardiovascular: Negative for chest pain, palpitations and leg swelling.  Gastrointestinal: Negative for nausea, vomiting, abdominal pain, diarrhea, constipation, blood in stool, abdominal distention and anal bleeding.  Genitourinary: Negative for dysuria, hematuria, flank pain and difficulty urinating.  Musculoskeletal: Negative for myalgias, back pain, joint swelling, arthralgias and gait problem.  Skin: Negative for color change, pallor, rash and wound.  Neurological: Negative for dizziness, tremors, weakness and light-headedness.  Hematological: Negative for adenopathy. Does not bruise/bleed easily.  Psychiatric/Behavioral: Negative for behavioral problems, confusion, sleep disturbance, dysphoric mood, decreased concentration and agitation.     OBJECTIVE: Temp:  [97.5 F (36.4 C)-98.2 F (36.8 C)] 97.5 F (36.4 C) (05/31 0537) Pulse Rate:  [73-78] 73 (05/31 0537) Resp:  [16] 16 (05/31 0537) BP: (127-131)/(85-91) 127/85 (05/31 0537) SpO2:  [98 %] 98 % (05/31 0537) Physical Exam  Constitutional: He is oriented to person, place, and time. He appears well-developed and thin. No distress.  HENT:  Mouth/Throat: Oropharynx is clear and moist. No oropharyngeal exudate.  Cardiovascular: Normal rate, regular rhythm and normal heart sounds. Exam reveals no gallop and no friction rub.  No murmur heard.  Pulmonary/Chest: Effort normal and breath sounds normal. No respiratory distress. He has no wheezes.  Abdominal: Soft. Bowel sounds are normal. He exhibits no distension. There is no tenderness.  Lymphadenopathy:  He has no cervical adenopathy.  Neurological: He is alert and oriented to person, place, and time.  Skin: Skin is warm and dry. No  rash noted. No erythema.  Psychiatric: He has a normal mood and affect. His behavior is normal.    LABS: Results for orders placed or performed during the hospital encounter of 08/21/17 (from the past 48 hour(s))  Occult blood card to lab, stool      Status: None   Collection Time: 08/22/17  4:00 PM  Result Value Ref Range   Fecal Occult Bld NEGATIVE NEGATIVE    Comment: Performed at [facility], [address] [place], [place] [address]  Procalcitonin     Status: None   Collection Time: 08/23/17  6:45 AM  Result Value Ref Range   Procalcitonin 0.20 ng/mL    Comment:        Interpretation: PCT (Procalcitonin) <= 0.5 ng/mL: Systemic infection (sepsis) is not likely. Local bacterial infection is possible. (NOTE)       Sepsis PCT Algorithm           Lower Respiratory Tract                                      Infection PCT Algorithm    ----------------------------     ----------------------------         PCT < 0.25 ng/mL                PCT < 0.10 ng/mL         Strongly encourage             Strongly discourage   discontinuation of antibiotics    initiation of antibiotics    ----------------------------     -----------------------------       PCT 0.25 - 0.50 ng/mL            PCT 0.10 - 0.25 ng/mL               OR       >80% decrease in PCT            Discourage initiation of                                            antibiotics      Encourage discontinuation           of antibiotics    ----------------------------     -----------------------------         PCT >= 0.50 ng/mL              PCT 0.26 - 0.50 ng/mL               AND        <80% decrease in PCT             Encourage initiation of                                             antibiotics       Encourage continuation           of antibiotics    ----------------------------     -----------------------------        PCT >= 0.50 ng/mL  PCT > 0.50 ng/mL               AND         increase in PCT                  Strongly encourage                                      initiation of antibiotics    Strongly encourage escalation           of antibiotics                                     -----------------------------                                            PCT <= 0.25 ng/mL                                                 OR                                        > 80% decrease in PCT                                     Discontinue / Do not initiate                                             antibiotics Performed at [facility], [address] [place], [place] [address]   CBC with Differential/Platelet     Status: Abnormal   Collection Time: 08/23/17  6:45 AM  Result Value Ref Range   WBC 4.4 4.0 - 10.5 K/uL   RBC 4.20 (L) 4.22 - 5.81 MIL/uL   Hemoglobin 10.4 (L) 13.0 - 17.0 g/dL   HCT 32.6 (L) 39.0 - 52.0 %   MCV 77.6 (L) 78.0 - 100.0 fL   MCH 24.8 (L) 26.0 - 34.0 pg   MCHC 31.9 30.0 - 36.0 g/dL   RDW 14.1 11.5 - 15.5 %   Platelets 84 (L) 150 - 400 K/uL    Comment: REPEATED TO VERIFY CONSISTENT WITH PREVIOUS RESULT    Neutrophils Relative % 62 %   Neutro Abs 2.7 1.7 - 7.7 K/uL   Lymphocytes Relative 23 %   Lymphs Abs 1.0 0.7 - 4.0 K/uL   Monocytes Relative 9 %   Monocytes Absolute 0.4 0.1 - 1.0 K/uL   Eosinophils Relative 5 %   Eosinophils Absolute 0.2 0.0 - 0.7 K/uL   Basophils Relative 0 %   Basophils Absolute 0.0 0.0 - 0.1 K/uL   Immature Granulocytes 1 %   Abs Immature Granulocytes 0.0 0.0 - 0.1 K/uL    Comment: Performed at [facility], [address] [place], [place] [address]  Basic metabolic panel     Status: Abnormal   Collection Time: 08/23/17  6:45 AM  Result Value Ref Range   Sodium 136 135 - 145 mmol/L   Potassium 4.0 3.5 - 5.1 mmol/L   Chloride 107 101 - 111 mmol/L   CO2 26 22 - 32 mmol/L   Glucose, Bld 100 (H) 65 - 99 mg/dL   BUN 14 6 - 20 mg/dL   Creatinine, Ser 1.40 (H) 0.61 - 1.24 mg/dL   Calcium 8.5 (L) 8.9 - 10.3 mg/dL   GFR calc non Af Amer 57 (L) >60 mL/min   GFR calc Af Amer >60 >60 mL/min    Comment: (NOTE) The eGFR has been calculated using the CKD EPI equation. This calculation has not been validated in all clinical situations. eGFR's persistently <60 mL/min signify  possible Chronic Kidney Disease.    Anion gap 3 (L) 5 - 15    Comment: Performed at [facility], [place] [address] [place], [place] [address]  ACTH stimulation, 3 time points     Status: None   Collection Time: 08/24/17  7:55 AM  Result Value Ref Range   Cortisol, Base 13.2 ug/dL    Comment: NO NORMAL RANGE ESTABLISHED FOR THIS TEST   Cortisol, 30 Min 18.8 ug/dL   Cortisol, 60 Min 21.0 ug/dL    Comment: Performed at [facility], [place] [address], [place], [place] [address]  CBC with Differential/Platelet     Status: Abnormal   Collection Time: 08/24/17  7:55 AM  Result Value Ref Range   WBC 3.6 (L) 4.0 - 10.5 K/uL   RBC 4.54 4.22 - 5.81 MIL/uL   Hemoglobin 11.2 (L) 13.0 - 17.0 g/dL   HCT 34.9 (L) 39.0 - 52.0 %   MCV 76.9 (L) 78.0 - 100.0 fL   MCH 24.7 (L) 26.0 - 34.0 pg   MCHC 32.1 30.0 - 36.0 g/dL   RDW 14.1 11.5 - 15.5 %   Platelets 85 (L) 150 - 400 K/uL    Comment: CONSISTENT WITH PREVIOUS RESULT   Neutrophils Relative % 51 %   Neutro Abs 1.9 1.7 - 7.7 K/uL   Lymphocytes Relative 28 %   Lymphs Abs 1.0 0.7 - 4.0 K/uL   Monocytes Relative 10 %   Monocytes Absolute 0.4 0.1 - 1.0 K/uL   Eosinophils Relative 9 %   Eosinophils Absolute 0.3 0.0 - 0.7 K/uL   Basophils Relative 1 %   Basophils Absolute 0.0 0.0 - 0.1 K/uL   Immature Granulocytes 1 %   Abs Immature Granulocytes 0.0 0.0 - 0.1 K/uL    Comment: Performed at [facility], [place] [address], [place], [place] [address]  Creatinine, serum     Status: Abnormal   Collection Time: 08/24/17  7:55 AM  Result Value Ref Range   Creatinine, Ser 1.50 (H) 0.61 - 1.24 mg/dL   GFR calc non Af Amer 53 (L) >60 mL/min   GFR calc Af Amer >60 >60 mL/min    Comment: (NOTE) The eGFR has been calculated using the CKD EPI equation. This calculation has not been validated in all clinical situations. eGFR's persistently <60 mL/min signify possible Chronic Kidney Disease. Performed at [facility], [place] [address]  [place], [place] [address]   Cortisol-am, blood     Status: None   Collection Time: 08/24/17  7:55 AM  Result Value Ref Range   Cortisol - AM 13.6  6.7 - 22.6 ug/dL    Comment: Performed at [facility], [place] [address] [place], [place] [address]  Hepatic function panel     Status: Abnormal   Collection Time: 08/24/17 11:33 AM  Result Value Ref Range   Total Protein 9.4 (H) 6.5 - 8.1 g/dL   Albumin 3.2 (L) 3.5 - 5.0 g/dL   AST 18 15 - 41 U/L   ALT 12 (L) 17 - 63 U/L   Alkaline Phosphatase 47 38 - 126 U/L   Total Bilirubin 0.5 0.3 - 1.2 mg/dL   Bilirubin, Direct <0.1 (L) 0.1 - 0.5 mg/dL   Indirect Bilirubin NOT CALCULATED 0.3 - 0.9 mg/dL    Comment: Performed at [facility] [address], [place], [place] [address]    MICRO: Urine cx 40,000 cfu staph aureus IMAGING: Mr Jeri Cos Wo Contrast  Result Date: 08/22/2017 CLINICAL DATA:  52 y/o M; pituitary/endocrine dysfunction, evaluate for pituitary adenoma. EXAM: MRI HEAD WITHOUT AND WITH CONTRAST TECHNIQUE: Multiplanar, multiecho pulse sequences of the brain and surrounding structures were obtained without and with intravenous contrast. Pituitary protocol. CONTRAST:  15m MULTIHANCE GADOBENATE DIMEGLUMINE 529 MG/ML IV SOLN COMPARISON:  08/21/2017 CT head FINDINGS: Pituitary: 2 mm late enhancing focus within the left aspect of the pituitary gland (series [id number] image 7). No invasion of surrounding structures. Midline infundibulum. Brain: No acute infarction, hemorrhage, hydrocephalus, extra-axial collection or mass lesion. Few scattered nonspecific foci of T2 FLAIR hyperintense signal abnormality in subcortical and periventricular white matter are compatible with mild chronic microvascular ischemic changes for age. Mild brain parenchymal volume loss. No abnormal enhancement of brain parenchyma. Vascular: Normal flow voids. Skull and upper cervical spine: Normal marrow signal. Sinuses/Orbits: Opacification of the right sphenoid and  bilateral maxillary sinuses with partial opacification of the right-sided ethmoid air cells. Mild diffuse mucosal thickening of frontal sinuses and left ethmoid air cells. Other: Orbits are unremarkable. IMPRESSION: 1. 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 2. Mild chronic microvascular ischemic changes and parenchymal volume loss of the brain. 3. Extensive paranasal sinus opacification. Electronically Signed   By: LKristine GarbeM.D.   On: 08/22/2017 23:10    Assessment/Plan:  89yoM with new diagnosis of HIV disease, unclear how long he may have contracted since has some symptoms of long standing disease including leukopenia, thrombocytopenia, protein-gap ,and +/- CKD. He has TLC of [phone], suggesting he has advanced disease  We recommend he starts taking biktarvy 1 tab by mouth daily ( please send his meds to [facility] on [address] -pharmacy picked in his EMR. -- not all pharmacy carry hiv meds. - we will start it today - we will order cd 4 count, HIV viral load, HIV genotype, Hepatitis panel - we have given him instructions to how to take medication and importance of adherence - we have given him copay card to help with out of pocket cost for biktarvy  He has appt with [facility], dr Rahm Minix, on Wednesday 6/5 at 2:30pm. At [facility] [address], phone [phone]   - please discontinue levofloxacin, and  - start him on bactrim ds daily for oi prophylaxis  ckd 2= will monitor as outpatient, no dose change with biktarvy. If associated with hiv disease, some may improve  Thrombocytopenia = will also check for hepatitis as contributor, but most likely due to hiv disease.  As outpatient, will check rpr and further testing needed.  Thank you for letting us be apart of his care.  Spent 90 min with greater than 50% with patient counseling and coordination of care for hiv disease.

## 2017-08-24 NOTE — Progress Notes (Signed)
Family Medicine Teaching Service Daily Progress Note Intern Pager: [phone]  Patient name: Hunter Graves Medical record number: [id number] Date of birth: 03/06/1966 Age: 52 y.o. Gender: male  Primary Care Provider: Midge Minium, MD Consultants: none Code Status: full  Pt Overview and Major Events to Date:  5/28 admitted to fpts 5/29 MRI brain, echo, EEG ordered  Assessment and Plan: Hunter Graves is a 52 y.o. male presenting with syncope. PMH is significant for 1x potential seizure as a teenager with very little medical treatment throughout life  Syncope Brain MRI negative for any acute findings. EEG negative. Likely vasovagal syncope. Orthostatics rechecked and were normal. Echo still pending. Continuing levoquin as URI may be contributory and respiratory symptoms are resolving. - continue telemetry - F/U echo - continue levoquin (5/28-)  Hypothyroidism TSH low at 0.254, T4 low at 0.69. Getting acth stim test this morning. If normal will just start synthroid. If abnormal will consider further workup for hypopituitarism. - ACTH level this AM - if normal, can send patient home with synthroid  Pancytopenia  proteinemia w/ protein gap Patient with wbc 4.4 up from 3.9 on 5/30. AM cbc still pending. SPEP and UPEP still pending, likely wont result until after discharge. Can be continue workup for this as outpatient. Iron deficiency anemia, receiving oral iron. Could likely use colonscopy as outpatient. Iron supplementation. - F/U SPEP - F/U UPEP - colonscopy as outpatient - Iron sulfate 325mg  daily  Cough Patient with bibasilar crackles on exam although improved on exam this am. Currently on levoquin. Procalcitonin negative. Improving - continue levoquin (5/28-)  LVH 1/6 systolic murmur appreciated on exam at admission. Appears to not be present this morning. Troponin 0.06 on presentation, down to <0.03 on recheck. Echo pending. - f/u echo  Hematuria: denies  urinary symptoms.  UA without nitrites or leukocytes -Repeat UA -May need outpatient urology follow-up if other work-ups are unrevealing  Elevated serum creatinine: unclear if this is AKI or CKD without prior serum creatinine.  Status post normal saline bolus in ED -Continue offering oral fluid -Repeat BMP  Hypomagnesemia: Mag 1.5 in ED. status post 2g. 1.9 on 5/29  Pain: patient is currently pain-free -prn tylenol  FEN/GI: regular PPx: SCDs  Disposition: likely home  Subjective:  Doing well this morning with no complaints. Just ready to have his echo done so he can possibly go home.  Objective: Temp:  [97.5 F (36.4 C)-98.2 F (36.8 C)] 97.5 F (36.4 C) (05/31 0537) Pulse Rate:  [73-88] 73 (05/31 0537) Resp:  [16-18] 16 (05/31 0537) BP: (122-131)/(76-91) 127/85 (05/31 0537) SpO2:  [98 %-100 %] 98 % (05/31 0537) Physical Exam: General: thin, african [place] male,  Resting comfortably in bed Eyes: eomi, perrl Cardiovascular: regular rate rhythm, no murmurs/rubs/gallops Respiratory: improving crackles in Bilateral Lower Lobe, Right>left, good air movement Gastrointestinal: non-tender, non-distended, soft Neuro: cn 2-12 intact, no focal neuro deficits  Laboratory: Recent Labs  Lab 08/21/17 1250 08/22/17 0242 08/23/17 0645  WBC 13.1* 3.9* 4.4  HGB 11.0* 9.6* 10.4*  HCT 33.8* 29.7* 32.6*  PLT 82* 80* 84*   Recent Labs  Lab 08/21/17 1250 08/21/17 1904 08/22/17 0242 08/23/17 0645  NA 138  --  137 136  K 3.7  --  4.1 4.0  CL 105  --  107 107  CO2 26  --  26 26  BUN 11  --  11 14  CREATININE 1.41* 1.44* 1.44* 1.40*  CALCIUM 8.6*  --  8.2* 8.5*  PROT 8.3*  --   --   --  BILITOT 0.4  --   --   --   ALKPHOS 40  --   --   --   ALT 12*  --   --   --   AST 19  --   --   --   GLUCOSE 108*  --  100* 100*    Imaging/Diagnostic Tests: CLINICAL DATA:  Syncopal episode today  EXAM: CT ANGIOGRAPHY CHEST WITH CONTRAST  TECHNIQUE: Multidetector CT imaging  of the chest was performed using the standard protocol during bolus administration of intravenous contrast. Multiplanar CT image reconstructions and MIPs were obtained to evaluate the vascular anatomy.  CONTRAST:  75 cc Isovue 370  COMPARISON:  Chest radiography same day  FINDINGS: Cardiovascular: Pulmonary arterial opacification is good. No pulmonary emboli. No aortic atherosclerosis. No coronary artery calcification. No cardiomegaly. No pericardial fluid.  Mediastinum/Nodes: No mass or lymphadenopathy  Lungs/Pleura: Bronchial thickening pattern centrally and in the lower lobes that could go along with bronchitis or asthma. Upper lungs are clear. No consolidation or collapse. No pleural effusion.  Upper Abdomen: Negative  Musculoskeletal: Negative  Review of the MIP images confirms the above findings.  IMPRESSION: No pulmonary emboli.  Pattern of bronchial thickening in the perihilar regions and lower lobes that could go along with bronchitis or asthma.   Electronically Signed   By: Nelson Chimes M.D.   On: 08/21/2017 16:19  Guadalupe Dawn, MD 08/24/2017, 8:59 AM PGY-1, [facility] Intern pager: [phone], text pages welcome

## 2017-08-25 LAB — HEPATITIS B CORE ANTIBODY, TOTAL: HEP B C TOTAL AB: NEGATIVE

## 2017-08-25 LAB — HEPATITIS B SURFACE ANTIGEN: Hepatitis B Surface Ag: NEGATIVE

## 2017-08-25 LAB — HEPATITIS B SURFACE ANTIBODY,QUALITATIVE: HEP B S AB: NONREACTIVE

## 2017-08-25 LAB — HEPATITIS C ANTIBODY: HCV Ab: 0.2 s/co ratio (ref 0.0–0.9)

## 2017-08-26 LAB — HIV-1 RNA QUANT-NO REFLEX-BLD
HIV 1 RNA Quant: 463000 copies/mL
LOG10 HIV-1 RNA: 5.666 log10copy/mL

## 2017-08-27 LAB — T-HELPER CELLS (CD4) COUNT (NOT AT ARMC)
CD4 T CELL HELPER: 6 % — AB (ref 33–55)
CD4 T Cell Abs: 70 /uL — ABNORMAL LOW (ref 400–2700)

## 2017-08-28 NOTE — Discharge Summary (Signed)
[facility] Discharge Summary  Patient name: Hunter Graves Medical record number: [id number] Date of birth: [birth year] Age: 52 y.o. Gender: male Date of Admission: 08/21/2017  Date of Discharge: 08/24/2017 Admitting Physician: Alveda Reasons, MD  Primary Care Provider: Midge Minium, MD Consultants: Infectious disease  Indication for Hospitalization: syncope  Discharge Diagnoses/Problem List:  Syncope Hypothyroidism Pancytopenia Cough Hematuria Elevated serum creatinine Hypomagnesemia  Disposition: home  Discharge Condition: stable  Discharge Exam: General:thin, african [place] male,  Resting comfortably in bed Eyes: eomi, perrl Cardiovascular:regular rate rhythm, no murmurs/rubs/gallops Respiratory:improving crackles in Bilateral Lower Lobe, Right>left, good air movement Gastrointestinal:non-tender, non-distended, soft Neuro: cn 2-12 intact, no focal neuro deficits  Brief Hospital Course:  Patient admitted on 5/28 for presumed syncopal event. [place]  Syncope Patient initially had + orthostatics in ed. Otherwise workup negative. MRI brain negative for any acute stroke. CT angio revealed no carotid stenosis bilaterally. Echo with normal EF and no area of turbulent flow. All electrolytes w/n/l. Thought to be vasovagal as patient had been outside for a long period of time and went into a cool restaurant.  Pancytopenia Patient with mildly low platelets, wbc, and hgb. Rebounded slightly over the course of the admission. Patient tested positive for HIV on 6/1. This is thought to be the causal agent for his pancytopenia. Infectious disease consulted. Patient has follow up scheduled on 6/5. He was discharged on biktarvy 1 tab daily. HIV viral load, dc 4 count, hiv genotype, and hepatitis panel all pending at time of discharge. Patient also had SPEP and UPEP drawn at admission.  Hypothyroidism Patient with mildly low T4  and TSH. Normal T3. Had MRI brain to evaluate for pituitary adenoma causing low TSH. Only 69mm microadenoma seen which by endocrine society guidelines dose not warrant follow up, but can be followed up with MRI brain yearly. Will defer to PCP for this choice, but could consider given low TSH. Also drew morning cortisol and ACTH level which were negative. Started patient on 45mcg synthroid prior to dc.  Elevated creatinine Patient with creatinine up to 1.5. Unclear what baseline is as he has not seen a doctor in multiple decades prior to admission. Could consider recheck of creatinine to see if resolved. Will likely need to be followed while on HIV meds.  Issues for Follow Up: 1. Ensure patient follows up with infectious disease on 6/5 2. Follow up HIV, hepatitis labs 2. Recheck thyroid level in in 2-3 months 3. Follow up on patient's spep and upep 4. Consider f/u MRI head in 1 year for pituitary microadenoma 5. Recheck creatinine  Significant Procedures:   Significant Labs and Imaging:  Recent Labs  Lab 08/22/17 0242 08/23/17 0645 08/24/17 0755  WBC 3.9* 4.4 3.6*  HGB 9.6* 10.4* 11.2*  HCT 29.7* 32.6* 34.9*  PLT 80* 84* 85*   Recent Labs  Lab 08/21/17 1250 08/21/17 1904 08/22/17 0242 08/23/17 0645 08/24/17 0755 08/24/17 1133  NA 138  --  137 136  --   --   K 3.7  --  4.1 4.0  --   --   CL 105  --  107 107  --   --   CO2 26  --  26 26  --   --   GLUCOSE 108*  --  100* 100*  --   --   BUN 11  --  11 14  --   --   CREATININE 1.41* 1.44* 1.44* 1.40* 1.50*  --  CALCIUM 8.6*  --  8.2* 8.5*  --   --   MG 1.5*  --  1.9  --   --   --   ALKPHOS 40  --   --   --   --  47  AST 19  --   --   --   --  18  ALT 12*  --   --   --   --  12*  ALBUMIN 2.8*  --   --   --   --  3.2*     Results/Tests Pending at Time of Discharge:   Discharge Medications:  Allergies as of 08/24/2017   No Known Allergies     Medication List    STOP taking these medications   MUCINEX 600 MG 12 hr  tablet Generic drug:  guaiFENesin   ROBITUSSIN DM PO     TAKE these medications   bictegravir-emtricitabine-tenofovir AF 50-200-25 MG Tabs tablet Commonly known as:  BIKTARVY Take 1 tablet by mouth daily.   ferrous sulfate 325 (65 FE) MG tablet Take 1 tablet (325 mg total) by mouth daily with breakfast.   levothyroxine 50 MCG tablet Commonly known as:  SYNTHROID, LEVOTHROID Take 1 tablet (50 mcg total) by mouth daily before breakfast.   sulfamethoxazole-trimethoprim 800-160 MG tablet Commonly known as:  BACTRIM DS,SEPTRA DS Take 1 tablet by mouth daily.       Discharge Instructions: Please refer to Patient Instructions section of EMR for full details.  Patient was counseled important signs and symptoms that should prompt return to medical care, changes in medications, dietary instructions, activity restrictions, and follow up appointments.   Follow-Up Appointments: Follow-up [place] [facility]. Go on 08/30/2017.   Why:  1:45 pm Contact information: [employer] Address: [address] [place], [place], [place] [address] Phone: [phone]       Carlyle Basques, MD. Go on 08/29/2017.   Specialty:  Infectious Diseases Why:  at 2:30 Contact information: [place] [address] [phone]           Guadalupe Dawn, MD 08/28/2017, 1:30 AM PGY-1, [facility]

## 2017-08-29 ENCOUNTER — Telehealth: Payer: Self-pay | Admitting: Behavioral Health

## 2017-08-29 ENCOUNTER — Encounter: Payer: Self-pay | Admitting: Internal Medicine

## 2017-08-29 ENCOUNTER — Observation Stay (HOSPITAL_COMMUNITY)
Admission: EM | Admit: 2017-08-29 | Discharge: 2017-08-30 | Disposition: A | Discharge status: Home / Self Care | Payer: BC Managed Care – PPO | Attending: Family Medicine | Admitting: Family Medicine

## 2017-08-29 ENCOUNTER — Ambulatory Visit: Payer: BC Managed Care – PPO | Admitting: Internal Medicine

## 2017-08-29 ENCOUNTER — Other Ambulatory Visit: Payer: Self-pay

## 2017-08-29 ENCOUNTER — Encounter (HOSPITAL_COMMUNITY): Payer: Self-pay | Admitting: Emergency Medicine

## 2017-08-29 VITALS — BP 101/69 | HR 96 | Temp 98.0°F | Resp 20 | Ht 72.0 in | Wt 121.0 lb

## 2017-08-29 DIAGNOSIS — D508 Other iron deficiency anemias: Secondary | ICD-10-CM | POA: Diagnosis not present

## 2017-08-29 DIAGNOSIS — E039 Hypothyroidism, unspecified: Secondary | ICD-10-CM | POA: Diagnosis not present

## 2017-08-29 DIAGNOSIS — N183 Chronic kidney disease, stage 3 (moderate): Secondary | ICD-10-CM | POA: Diagnosis not present

## 2017-08-29 DIAGNOSIS — B20 Human immunodeficiency virus [HIV] disease: Secondary | ICD-10-CM

## 2017-08-29 DIAGNOSIS — Z79890 Hormone replacement therapy (postmenopausal): Secondary | ICD-10-CM | POA: Insufficient documentation

## 2017-08-29 DIAGNOSIS — D631 Anemia in chronic kidney disease: Secondary | ICD-10-CM | POA: Insufficient documentation

## 2017-08-29 DIAGNOSIS — Z23 Encounter for immunization: Secondary | ICD-10-CM

## 2017-08-29 DIAGNOSIS — N189 Chronic kidney disease, unspecified: Secondary | ICD-10-CM

## 2017-08-29 DIAGNOSIS — D696 Thrombocytopenia, unspecified: Secondary | ICD-10-CM

## 2017-08-29 DIAGNOSIS — E038 Other specified hypothyroidism: Secondary | ICD-10-CM | POA: Diagnosis not present

## 2017-08-29 DIAGNOSIS — R636 Underweight: Secondary | ICD-10-CM | POA: Insufficient documentation

## 2017-08-29 DIAGNOSIS — R55 Syncope and collapse: Secondary | ICD-10-CM | POA: Diagnosis present

## 2017-08-29 DIAGNOSIS — Z79899 Other long term (current) drug therapy: Secondary | ICD-10-CM | POA: Diagnosis not present

## 2017-08-29 DIAGNOSIS — Z681 Body mass index (BMI) 19 or less, adult: Secondary | ICD-10-CM | POA: Insufficient documentation

## 2017-08-29 HISTORY — DX: Asymptomatic human immunodeficiency virus (hiv) infection status: Z21

## 2017-08-29 HISTORY — DX: Human immunodeficiency virus (HIV) disease: B20

## 2017-08-29 LAB — CBC WITH DIFFERENTIAL/PLATELET
Abs Immature Granulocytes: 0.1 10*3/uL (ref 0.0–0.1)
BASOS ABS: 0 10*3/uL (ref 0.0–0.1)
Basophils Relative: 0 %
EOS PCT: 4 %
Eosinophils Absolute: 0.4 10*3/uL (ref 0.0–0.7)
HCT: 33 % — ABNORMAL LOW (ref 39.0–52.0)
Hemoglobin: 10.6 g/dL — ABNORMAL LOW (ref 13.0–17.0)
Immature Granulocytes: 1 %
LYMPHS PCT: 12 %
Lymphs Abs: 1.2 10*3/uL (ref 0.7–4.0)
MCH: 25.1 pg — AB (ref 26.0–34.0)
MCHC: 32.1 g/dL (ref 30.0–36.0)
MCV: 78 fL (ref 78.0–100.0)
MONO ABS: 0.4 10*3/uL (ref 0.1–1.0)
MONOS PCT: 4 %
Neutro Abs: 8.1 10*3/uL — ABNORMAL HIGH (ref 1.7–7.7)
Neutrophils Relative %: 79 %
Platelets: 82 10*3/uL — ABNORMAL LOW (ref 150–400)
RBC: 4.23 MIL/uL (ref 4.22–5.81)
RDW: 14.9 % (ref 11.5–15.5)
WBC: 10.2 10*3/uL (ref 4.0–10.5)

## 2017-08-29 LAB — BASIC METABOLIC PANEL
Anion gap: 10 (ref 5–15)
BUN: 17 mg/dL (ref 6–20)
CALCIUM: 8.6 mg/dL — AB (ref 8.9–10.3)
CO2: 23 mmol/L (ref 22–32)
Chloride: 108 mmol/L (ref 101–111)
Creatinine, Ser: 1.8 mg/dL — ABNORMAL HIGH (ref 0.61–1.24)
GFR calc Af Amer: 49 mL/min — ABNORMAL LOW (ref >60)
GFR, EST NON AFRICAN AMERICAN: 42 mL/min — AB (ref >60)
GLUCOSE: 137 mg/dL — AB (ref 65–99)
POTASSIUM: 4 mmol/L (ref 3.5–5.1)
Sodium: 141 mmol/L (ref 135–145)

## 2017-08-29 LAB — MAGNESIUM: MAGNESIUM: 1.6 mg/dL — AB (ref 1.7–2.4)

## 2017-08-29 LAB — CBG MONITORING, ED: Glucose-Capillary: 76 mg/dL (ref 65–99)

## 2017-08-29 MED ORDER — SULFAMETHOXAZOLE-TRIMETHOPRIM 800-160 MG PO TABS
1.0000 | ORAL_TABLET | Freq: Every day | ORAL | Status: DC
  Administered 2017-08-30: 1 via ORAL
  Filled 2017-08-29: qty 1

## 2017-08-29 MED ORDER — FERROUS SULFATE 325 (65 FE) MG PO TABS
325.0000 mg | ORAL_TABLET | Freq: Every day | ORAL | Status: DC
  Administered 2017-08-30: 325 mg via ORAL
  Filled 2017-08-29 (×2): qty 1

## 2017-08-29 MED ORDER — BICTEGRAVIR-EMTRICITAB-TENOFOV 50-200-25 MG PO TABS: 1.0000 | ORAL_TABLET | Freq: Every day | ORAL | 5 refills | Status: DC

## 2017-08-29 MED ORDER — SODIUM CHLORIDE 0.9 % IV BOLUS
1000.0000 mL | Freq: Once | INTRAVENOUS | Status: AC
  Administered 2017-08-29: 1000 mL via INTRAVENOUS

## 2017-08-29 MED ORDER — LEVOTHYROXINE SODIUM 50 MCG PO TABS
50.0000 ug | ORAL_TABLET | Freq: Every day | ORAL | Status: DC
  Administered 2017-08-30: 50 ug via ORAL
  Filled 2017-08-29 (×2): qty 1

## 2017-08-29 MED ORDER — MAGNESIUM SULFATE 2 GM/50ML IV SOLN
2.0000 g | Freq: Once | INTRAVENOUS | Status: AC
Start: 2017-08-29 — End: 2017-08-30
  Administered 2017-08-30: 2 g via INTRAVENOUS
  Filled 2017-08-29: qty 50

## 2017-08-29 MED ORDER — BICTEGRAVIR-EMTRICITAB-TENOFOV 50-200-25 MG PO TABS
1.0000 | ORAL_TABLET | Freq: Every day | ORAL | Status: DC
  Administered 2017-08-30: 1 via ORAL
  Filled 2017-08-29: qty 1

## 2017-08-29 MED ORDER — ENSURE ENLIVE PO LIQD: 237.0000 mL | Freq: Two times a day (BID) | ORAL | Status: DC

## 2017-08-29 NOTE — ED Provider Notes (Signed)
[facility] EMERGENCY DEPARTMENT Provider Note   CSN: [id number] Arrival date & time: 08/29/17  1609     History   Chief Complaint Chief Complaint  Patient presents with  . Seizures    HPI Hunter Graves is a 52 y.o. male.  HPI  52 year old male with a relatively new onset diagnosis of HIV presents from the infectious disease clinic with concern for seizure.  The patient was in follow-up from his admission last week and was having blood drawn.  He states that he always has a hard time getting blood drawn.  When the needle was being removed he started to feel very lightheaded like he is going to pass out.  Next thing he knows he laid his head back and then passed out. Report from the infectious disease clinic was that the patient had a seizure.  The patient denies any headache, blurry vision, or focal weakness/numbness.  He currently feels okay except that his mouth is dry.  No tongue injury.  He was incontinent of urine.  He states he was feeling better after his recent admission where they were unsure if he had had a seizure versus syncope.  Past Medical History:  Diagnosis Date  . HIV (human immunodeficiency virus infection) ([place])   . Seizures ([place])    "think I had my 1st today; they are still not sure" (08/21/2017)  . Syncope and collapse    "think I had my 1st today; they are still not sure" (08/21/2017)    Patient Active Problem List   Diagnosis Date Noted  . Syncope 08/21/2017  . Thrombocytopenia ([place])   . Microcytic anemia   . Elevated serum creatinine   . Hypomagnesemia     Past Surgical History:  Procedure Laterality Date  . NO PAST SURGERIES          Home Medications    Prior to Admission medications   Medication Sig Start Date End Date Taking? Authorizing Provider  bictegravir-emtricitabine-tenofovir AF (BIKTARVY) 50-200-25 MG TABS tablet Take 1 tablet by mouth daily. 08/29/17   Carlyle Basques, MD  ferrous sulfate 325 (65 FE) MG tablet  Take 1 tablet (325 mg total) by mouth daily with breakfast. 08/25/17   Guadalupe Dawn, MD  levothyroxine (SYNTHROID, LEVOTHROID) 50 MCG tablet Take 1 tablet (50 mcg total) by mouth daily before breakfast. 08/25/17   Guadalupe Dawn, MD  sulfamethoxazole-trimethoprim (BACTRIM DS,SEPTRA DS) 800-160 MG tablet Take 1 tablet by mouth daily. 08/24/17   Steve Rattler, DO    Family History History reviewed. No pertinent family history.  Social History Social History   Tobacco Use  . Smoking status: Never Smoker  . Smokeless tobacco: Never Used  Substance Use Topics  . Alcohol use: Not Currently    Comment: 08/21/2017 "1 drink q 6 months or less"  . Drug use: Not Currently     Allergies   Patient has no known allergies.   Review of Systems Review of Systems  Constitutional: Negative for fever.  Eyes: Negative for visual disturbance.  Respiratory: Negative for shortness of breath.   Cardiovascular: Negative for chest pain.  Gastrointestinal: Negative for nausea and vomiting.  Musculoskeletal: Negative for neck pain and neck stiffness.  Neurological: Positive for dizziness, seizures and syncope. Negative for headaches.  All other systems reviewed and are negative.    Physical Exam Updated Vital Signs BP 117/75 (BP Location: Right Arm)   Pulse 82   Temp 97.6 F (36.4 C) (Oral)   Resp 13  Ht 6' (1.829 m)   Wt 54.9 kg (121 lb)   SpO2 99%   BMI 16.41 kg/m   Physical Exam  Constitutional: He is oriented to person, place, and time. He appears well-developed and well-nourished. No distress.  HENT:  Head: Normocephalic and atraumatic.  Right Ear: External ear normal.  Left Ear: External ear normal.  Nose: Nose normal.  Eyes: Pupils are equal, round, and reactive to light. EOM are normal. Right eye exhibits no discharge. Left eye exhibits no discharge.  Neck: Normal range of motion. Neck supple. No neck rigidity.  Cardiovascular: Normal rate, regular rhythm and normal heart  sounds.  Pulmonary/Chest: Effort normal and breath sounds normal.  Abdominal: Soft. There is no tenderness.  Musculoskeletal: He exhibits no edema.  Neurological: He is alert and oriented to person, place, and time.  CN 3-12 grossly intact. 5/5 strength in all 4 extremities. Grossly normal sensation. Normal finger to nose.   Skin: Skin is warm and dry. He is not diaphoretic.  Nursing note and vitals reviewed.    ED Treatments / Results  Labs (all labs ordered are listed, but only abnormal results are displayed) Labs Reviewed  BASIC METABOLIC PANEL - Abnormal; Notable for the following components:      Result Value   Glucose, Bld 137 (*)    Creatinine, Ser 1.80 (*)    Calcium 8.6 (*)    GFR calc non Af Amer 42 (*)    GFR calc Af Amer 49 (*)    All other components within normal limits  CBC WITH DIFFERENTIAL/PLATELET - Abnormal; Notable for the following components:   Hemoglobin 10.6 (*)    HCT 33.0 (*)    MCH 25.1 (*)    Platelets 82 (*)    Neutro Abs 8.1 (*)    All other components within normal limits  MAGNESIUM - Abnormal; Notable for the following components:   Magnesium 1.6 (*)    All other components within normal limits  RAPID URINE DRUG SCREEN, HOSP PERFORMED  CBG MONITORING, ED    EKG EKG Interpretation  Date/Time:  Wednesday August 29 2017 16:13:20 EDT Ventricular Rate:  85 PR Interval:    QRS Duration: 101 QT Interval:  386 QTC Calculation: 459 R Axis:   67 Text Interpretation:  Normal sinus rhythm Probable anteroseptal infarct, old ST elevation suggests acute pericarditis no significant change since Aug 22 2017 Confirmed by Sherwood Gambler [phone]) on 08/29/2017 4:20:29 PM   Radiology No results found.  Procedures Procedures (including critical care time)  Medications Ordered in ED Medications - No data to display   Initial Impression / Assessment and Plan / ED Course  I have reviewed the triage vital signs and the nursing notes.  Pertinent labs &  imaging results that were available during my care of the patient were reviewed by me and considered in my medical decision making (see chart for details).     Patient is well-appearing at this time and has no acute distress.  He likely had a convulsive syncopal episode and I doubt this was a true seizure.  Discussed with Dr. Cheral Marker of neurology who agrees.  I discussed the case further with ID, Dr. Baxter Flattery, who thinks this is probably also the most likely cause and recommends observation to the family practice service but at this point no LP or further seizure work-up/treatment.  He recently had CT head and MRI there are unremarkable anti-do not know that repeat would be helpful at this time.  Family practice  to admit.  Final Clinical Impressions(s) / ED Diagnoses   Final diagnoses:  Convulsive syncope    ED Discharge Orders    None       Sherwood Gambler, MD 08/30/17 0045

## 2017-08-29 NOTE — Progress Notes (Signed)
Patient's cellphone, keys and glasses placed in a bag and given to patient to make sure they went to the Emergency department with patient. Pricilla Riffle RN

## 2017-08-29 NOTE — Progress Notes (Signed)
RFV: follow up for hospitalization/recently diagnosed with [name], male   DOB: Apr 04, 1965, 52 y.o.   MRN: [id number]  HPI 52yo M recently diagnosed with hiv disease last week as part of his hospitalization, CD 4 count of 70/VL 463,000. Started on biktarvy plus bactrim ds on 5/31. He states that he has not had any difficulties with taking his medication. Not noticing any N/V. His appetite has picked up and has been eating very well. Overall doing, he states he is adjusting to his diagnosis of HIV.  --------------------------- Addendum: the patient was found to have felt light headed, then witnessed to have 30-60 second spell of tonic-clonic seizure like activity after having his blood draw. After the spell, he was oriented to self and place, was tachycardic, diaphoretic, and clammy. His VS were WNL, but slight tachycardia 110s. On repeat BP, his SBp dropped to mid 90s. BS was 110s. He states that he had seizure history as a child but not as an adult, not on any anti-epileptics. He had urinated on himself during the episode. EMS was called for transport to the hospital for further evaluation. Interestingly during his hospitalization, he was not evaluated for seizure disorder since no clinical symptoms to suggest it. Patient reports fear of needles/blood-draws.  Outpatient Encounter Medications as of 08/29/2017  Medication Sig  . bictegravir-emtricitabine-tenofovir AF (BIKTARVY) 50-200-25 MG TABS tablet Take 1 tablet by mouth daily.  . ferrous sulfate 325 (65 FE) MG tablet Take 1 tablet (325 mg total) by mouth daily with breakfast.  . levothyroxine (SYNTHROID, LEVOTHROID) 50 MCG tablet Take 1 tablet (50 mcg total) by mouth daily before breakfast.  . sulfamethoxazole-trimethoprim (BACTRIM DS,SEPTRA DS) 800-160 MG tablet Take 1 tablet by mouth daily.   No facility-administered encounter medications on file as of 08/29/2017.      Patient Active Problem List   Diagnosis Date  Noted  . Syncope 08/21/2017  . Thrombocytopenia ([place])   . Microcytic anemia   . Elevated serum creatinine   . Hypomagnesemia      Health Maintenance Due  Topic Date Due  . HIV Screening  11/16/1981  . TETANUS/TDAP  11/16/1985  . COLONOSCOPY  11/16/2016     Review of Systems Review of Systems  Constitutional: Negative for fever, chills, diaphoresis, activity change, appetite change, fatigue and unexpected weight change.  HENT: Negative for congestion, sore throat, rhinorrhea, sneezing, trouble swallowing and sinus pressure.  Eyes: Negative for photophobia and visual disturbance.  Respiratory: Negative for cough, chest tightness, shortness of breath, wheezing and stridor.  Cardiovascular: Negative for chest pain, palpitations and leg swelling.  Gastrointestinal: Negative for nausea, vomiting, abdominal pain, diarrhea, constipation, blood in stool, abdominal distention and anal bleeding.  Genitourinary: Negative for dysuria, hematuria, flank pain and difficulty urinating.  Musculoskeletal: Negative for myalgias, back pain, joint swelling, arthralgias and gait problem.  Skin: Negative for color change, pallor, rash and wound.  Neurological: Negative for dizziness, tremors, weakness and light-headedness.  Hematological: Negative for adenopathy. Does not bruise/bleed easily.  Psychiatric/Behavioral: Negative for behavioral problems, confusion, sleep disturbance, dysphoric mood, decreased concentration and agitation.    Physical Exam   BP 112/73   Pulse 92   Temp 97.8 F (36.6 C) (Oral)   Ht 6' (1.829 m)   Wt 121 lb (54.9 kg)   BMI 16.41 kg/m   Physical Exam  Constitutional: He is oriented to person, place, and time. He appears well-developed and well-nourished. No distress.  HENT:  Mouth/Throat: Oropharynx is clear and  moist. No oropharyngeal exudate.  Cardiovascular: Normal rate, regular rhythm and normal heart sounds. Exam reveals no gallop and no friction rub.  No murmur  heard.  Pulmonary/Chest: Effort normal and breath sounds normal. No respiratory distress. He has no wheezes.  Abdominal: Soft. Bowel sounds are normal. He exhibits no distension. There is no tenderness.  Lymphadenopathy:  He has no cervical adenopathy.  Neurological: He is alert and oriented to person, place, and time.  Skin: hyperpigmentation to face Psychiatric: He has a normal mood and affect. His behavior is normal.    Lab Results  Component Value Date   CD4TCELL 6 (L) 08/24/2017   Lab Results  Component Value Date   CD4TABS 70 (L) 08/24/2017   Lab Results  Component Value Date   HIV1RNAQUANT 463,000 08/24/2017   Lab Results  Component Value Date   HEPBSAB Non Reactive 08/24/2017   No results found for: RPR, LABRPR  CBC Lab Results  Component Value Date   WBC 3.6 (L) 08/24/2017   RBC 4.54 08/24/2017   HGB 11.2 (L) 08/24/2017   HCT 34.9 (L) 08/24/2017   PLT 85 (L) 08/24/2017   MCV 76.9 (L) 08/24/2017   MCH 24.7 (L) 08/24/2017   MCHC 32.1 08/24/2017   RDW 14.1 08/24/2017   LYMPHSABS 1.0 08/24/2017   MONOABS 0.4 08/24/2017   EOSABS 0.3 08/24/2017    BMET Lab Results  Component Value Date   NA 136 08/23/2017   K 4.0 08/23/2017   CL 107 08/23/2017   CO2 26 08/23/2017   GLUCOSE 100 (H) 08/23/2017   BUN 14 08/23/2017   CREATININE 1.50 (H) 08/24/2017   CALCIUM 8.5 (L) 08/23/2017   GFRNONAA 53 (L) 08/24/2017   GFRAA >60 08/24/2017      Assessment and Plan  hiv disease = will continue on biktarvy. Will check genotype since not done. Will check SCrAg, hep A ab. Urine cytology for GC/chlam  Health maintenance = will give prevnar 13 today, but wait until others - will wait to give his vaccine at 3 months of treatment to see if CD 4 count > 200?  ----------------- Addendum = seizure like activity/seizure=  Recommend for work up for new onset seizure. Would benefit from repeat imaging of CNS. Will ID consult team follow while at hospital. Informed FP team  that patient is being sent to the ED.  Spent 60 min in direct patient care with management of syncope/seizure

## 2017-08-29 NOTE — Progress Notes (Signed)
Inpatient Family Medicine Teaching Service (Dr. Shawna Orleans) notified about transfer of patient via EMS back to [facility] after witnessed seizure in office today for admission and ongoing care. Informed them that our inpatient ID team will also follow with the patient.   Janene Madeira, NP

## 2017-08-29 NOTE — H&P (Addendum)
[facility] Admission History and Physical Service Pager: [phone]  Patient name: Hunter Graves Medical record number: [id number] Date of birth: [birth year] Age: 52 y.o. Gender: male  Primary Care Provider: Midge Minium, MD Consultants: ID Code Status: FULL  Chief Complaint: syncope  Assessment and Plan: Hunter Graves is a 52 y.o. male presenting with syncope . PMH is significant for HIV, hypothyroidism, and iron deficiency anemia.   Syncope: Most consistent with convulsive vasovagal syncope. Less likely seizure, as patient remembers event and denies post-ictal state. Also with trigger that has caused similar episodes in the past (sight of needle). Labs unremarkable, with normal CBG and no signs of infection. Recent CT head (05/28) and MRI brain (08/22/17) with no acute abnormalities.  - Place in observation, attending Nori Riis - Seizure precautions, Q4H neuro checks - UDS pending  HIV: Recently diagnosed (05/28). Viral load 463,000 (05/31), CD4 count 73 (improved from prior). Following with ID outpatient.  - Continue home Biktarvy, prophylactic Bactrim - F/u viral load  Hypothyroidism:  - Continue home Synthroid  Iron deficiency anemia: Iron level of 20 on 08/21/17, with normal ferritin, TIBC. Hgb 10.6 at admission. Baseline ~10. Takes Ferrous sulfate 325 mg daily. - Continue home iron  CKD Stage 3A: Creatinine 1.80 at admission, baseline appears to be ~1.40. - avoid nephrotoxic agents  FEN/GI: regular diet Prophylaxis: SCDs  Disposition: place in observation  History of Present Illness:  Hunter Graves is a 52 y.o. male presenting with syncope.  He thinks that his syncope is due to needles. When he was getting blood drawn at his ID clinic appointment, he looked down and saw needle in his arm and felt "that feeling." Says he "just panics." Has had similar episodes in past. Endorses diaphoresis, nausea, and urge to urinate afterwards. One  episode of vomiting in ambulance en route to hospital. Remembers events surrounding episode. Did not feel confused or lethargic after the event. Had a similar episode of possible seizure in the past as a teenager, but this occurred after he cut himself and saw blood at that time. Did well with IV placement and blood draw in ED.  Feels well currently and has been taking his medications as prescribed.  His appetite has increased significantly since starting his HIV medication.  Review Of Systems: Per HPI with the following additions:   Review of Systems  Constitutional: Positive for chills and diaphoresis. Negative for fever.  HENT: Negative for congestion.        Dry mouth  Respiratory: Negative for cough and shortness of breath.   Cardiovascular: Negative for chest pain.  Gastrointestinal: Positive for nausea and vomiting. Negative for abdominal pain.  Genitourinary: Negative for dysuria.  Neurological: Positive for loss of consciousness.  Endo/Heme/Allergies: Does not bruise/bleed easily.    Patient Active Problem List   Diagnosis Date Noted  . Vasovagal syncope 08/29/2017  . Syncope 08/21/2017  . Thrombocytopenia ([place])   . Microcytic anemia   . Elevated serum creatinine   . Hypomagnesemia     Past Medical History: Past Medical History:  Diagnosis Date  . HIV (human immunodeficiency virus infection) ([place])   . Seizures ([place])    "think I had my 1st today; they are still not sure" (08/21/2017)  . Syncope and collapse    "think I had my 1st today; they are still not sure" (08/21/2017)    Past Surgical History: Past Surgical History:  Procedure Laterality Date  . NO PAST SURGERIES  Social History: Social History   Tobacco Use  . Smoking status: Never Smoker  . Smokeless tobacco: Never Used  Substance Use Topics  . Alcohol use: Not Currently    Comment: 08/21/2017 "1 drink q 6 months or less"  . Drug use: Not Currently     Please also refer to relevant sections of  EMR.  Family History: History reviewed. No pertinent family history.  Allergies and Medications: No Known Allergies No current facility-administered medications on file prior to encounter.    Current Outpatient Medications on File Prior to Encounter  Medication Sig Dispense Refill  . bictegravir-emtricitabine-tenofovir AF (BIKTARVY) 50-200-25 MG TABS tablet Take 1 tablet by mouth daily. 30 tablet 5  . ferrous sulfate 325 (65 FE) MG tablet Take 1 tablet (325 mg total) by mouth daily with breakfast. 30 tablet 0  . levothyroxine (SYNTHROID, LEVOTHROID) 50 MCG tablet Take 1 tablet (50 mcg total) by mouth daily before breakfast. 30 tablet 0  . sulfamethoxazole-trimethoprim (BACTRIM DS,SEPTRA DS) 800-160 MG tablet Take 1 tablet by mouth daily. 30 tablet 0    Objective: BP 110/69   Pulse 81   Temp 97.6 F (36.4 C) (Oral)   Resp 19   Ht 6' (1.829 m)   Wt 121 lb (54.9 kg)   SpO2 98%   BMI 16.41 kg/m  Physical Exam  Constitutional: He is oriented to person, place, and time. No distress.  Underweight, with bitemporal wasting  HENT:  Head: Normocephalic and atraumatic.  Nose: Nose normal.  Mouth/Throat: Oropharynx is clear and moist.  Eyes: Pupils are equal, round, and reactive to light. Conjunctivae and EOM are normal.  Neck: Normal range of motion. Neck supple.  Cardiovascular: Normal rate and regular rhythm.  Pulmonary/Chest: Effort normal and breath sounds normal. No respiratory distress.  Abdominal: Soft. Bowel sounds are normal.  Musculoskeletal: Normal range of motion. He exhibits no deformity.  Neurological: He is alert and oriented to person, place, and time. No cranial nerve deficit or sensory deficit. He exhibits normal muscle tone. Coordination normal.  Skin: Skin is warm and dry. Capillary refill takes less than 2 seconds. No rash noted.  Psychiatric: He has a normal mood and affect. His behavior is normal.     Labs and Imaging: CBC BMET  Recent Labs  Lab  08/29/17 1644  WBC 10.2  HGB 10.6*  HCT 33.0*  PLT PENDING   Recent Labs  Lab 08/29/17 1644  NA 141  K 4.0  CL 108  CO2 23  BUN 17  CREATININE 1.80*  GLUCOSE 137*  CALCIUM 8.6*     No results found.  Kathrene Alu, MD 08/29/2017, 6:56 PM PGY-1, [place] Intern pager: [phone], text pages welcome  UPPER LEVEL ADDENDUM  I have read the above note and made revisions highlighted in orange.  Adin Hector, MD, MPH PGY-3 [facility] Pager [phone]

## 2017-08-29 NOTE — Progress Notes (Signed)
Spoke with dr Verner Chol for ED. Patient likely has convulsive syncope from a vasovagal episode. Prolonged diaphoresis likely from syncope. No need for AED nor LP. Would recommend to admit for overnight observation  NOTE: patient has fear of needles and reports prior vasovagal episodes

## 2017-08-29 NOTE — Addendum Note (Signed)
Addended by: Dolan Amen D on: 08/29/2017 04:58 PM   Modules accepted: Orders

## 2017-08-29 NOTE — ED Triage Notes (Signed)
Per EMS patient from infectious disease clinic across the street. Patient recently diagnosed with HIV and was receiving treatment at the clinic. Patient had clonic/tonic seizure while being assessed. No fall or injury reported. Was also incontinent of urine.  EMS gave 4mg  IV Zofran, 20g IV Left AC. Patient AOx4 and stable at this time.

## 2017-08-29 NOTE — ED Notes (Signed)
Staff arrived to transport their pt to the floor.

## 2017-08-29 NOTE — ED Notes (Signed)
CBG 76 

## 2017-08-29 NOTE — ED Notes (Signed)
Attempted report and secretary took a call back number with no reason provided.

## 2017-08-29 NOTE — Progress Notes (Signed)
HPI: Hunter Graves is a 52 y.o. male who presents to the [facility] for HIV follow-up.   Patient Active Problem List   Diagnosis Date Noted  . Syncope 08/21/2017  . Thrombocytopenia ([place])   . Microcytic anemia   . Elevated serum creatinine   . Hypomagnesemia     Patient's Medications  New Prescriptions   No medications on file  Previous Medications   BICTEGRAVIR-EMTRICITABINE-TENOFOVIR AF (BIKTARVY) 50-200-25 MG TABS TABLET    Take 1 tablet by mouth daily.   FERROUS SULFATE 325 (65 FE) MG TABLET    Take 1 tablet (325 mg total) by mouth daily with breakfast.   LEVOTHYROXINE (SYNTHROID, LEVOTHROID) 50 MCG TABLET    Take 1 tablet (50 mcg total) by mouth daily before breakfast.   SULFAMETHOXAZOLE-TRIMETHOPRIM (BACTRIM DS,SEPTRA DS) 800-160 MG TABLET    Take 1 tablet by mouth daily.  Modified Medications   No medications on file  Discontinued Medications   No medications on file    Allergies: No Known Allergies  Past Medical History: Past Medical History:  Diagnosis Date  . Seizures ([place])    "think I had my 1st today; they are still not sure" (08/21/2017)  . Syncope and collapse    "think I had my 1st today; they are still not sure" (08/21/2017)    Social History: Social History   Socioeconomic History  . Marital status: Single    Spouse name: Not on file  . Number of children: Not on file  . Years of education: Not on file  . Highest education level: Not on file  Occupational History  . Not on file  Social Needs  . Financial resource strain: Not on file  . Food insecurity:    Worry: Not on file    Inability: Not on file  . Transportation needs:    Medical: Not on file    Non-medical: Not on file  Tobacco Use  . Smoking status: Never Smoker  . Smokeless tobacco: Never Used  Substance and Sexual Activity  . Alcohol use: Yes    Comment: 08/21/2017 "1 drink q 6 months or less"  . Drug use: Never  . Sexual activity: Not Currently  Lifestyle  . Physical  activity:    Days per week: Not on file    Minutes per session: Not on file  . Stress: Not on file  Relationships  . Social connections:    Talks on phone: Not on file    Gets together: Not on file    Attends religious service: Not on file    Active member of club or organization: Not on file    Attends meetings of clubs or organizations: Not on file    Relationship status: Not on file  Other Topics Concern  . Not on file  Social History Narrative  . Not on file    Labs: Lab Results  Component Value Date   HIV1RNAQUANT 463,000 08/24/2017   CD4TABS 70 (L) 08/24/2017    RPR and STI No results found for: LABRPR, RPRTITER  No flowsheet data found.  Hepatitis B Lab Results  Component Value Date   HEPBSAB Non Reactive 08/24/2017   HEPBSAG Negative 08/24/2017   HEPBCAB Negative 08/24/2017   Hepatitis C No results found for: HEPCAB, HCVRNAPCRQN Hepatitis A No results found for: HAV Lipids: Lab Results  Component Value Date   CHOL 92 08/21/2017   TRIG 55 08/21/2017   HDL 30 (L) 08/21/2017   CHOLHDL 3.1 08/21/2017  VLDL 11 08/21/2017   LDLCALC 51 08/21/2017    Current HIV Regimen: Biktarvy  Assessment: Hunter Graves is here today for a hospital follow-up with Dr. Baxter Flattery where he was recently diagnosed with HIV.  He had an initiate HIV viral load of 463,000 and a CD4 count of 70. He was started on Biktarvy and Bactrim.  He tells me today that he had no issues getting Biktarvy or Bactrim from the [facility] on [address].  He has insurance and gave them a [place] co-pay card where he only had to pay ~$2. He hasn't missed a dose since he started ~1 week ago.  No side effects except that he is noticing that he is a lot more hungry. I told him that it was common for that to happen once on medications for HIV.  His immune system is quite low and as his virus gets under control, I told him that he should start feeling better.  I gave him my card and told him to call me with  ANY issues in the future.   Plan: - Continue Biktarvy PO once daily  Cassie L. Kuppelweiser, PharmD, AAHIVP, CPP Infectious Diseases [facility] 08/29/2017, 2:28 PM    [facility] [address]

## 2017-08-29 NOTE — Telephone Encounter (Signed)
Informed patient's aunt Karle Barr per patient that he was transported to the [facility] emergency room due to syncopal episode after having blood drawn.  Patient's aunts states the patient is terrified of needles and had GI upset n/v every time he had to have a needle stick when he was in the hospital last week.  She also stated patient had never been in the hospital prior to his last hospitalization.    She states the patient's mother is on her way to [facility] today and they will go over to the hospital when he calls them to come.   Patient's belongings were sent with him to the ER. Pricilla Riffle RN

## 2017-08-30 ENCOUNTER — Encounter: Payer: Self-pay | Admitting: Family Medicine

## 2017-08-30 ENCOUNTER — Other Ambulatory Visit: Payer: Self-pay

## 2017-08-30 ENCOUNTER — Ambulatory Visit: Payer: BC Managed Care – PPO | Admitting: Family Medicine

## 2017-08-30 VITALS — BP 92/60 | HR 93 | Temp 97.9°F | Resp 16 | Ht 69.5 in | Wt 122.6 lb

## 2017-08-30 DIAGNOSIS — R8271 Bacteriuria: Secondary | ICD-10-CM | POA: Diagnosis not present

## 2017-08-30 DIAGNOSIS — E038 Other specified hypothyroidism: Secondary | ICD-10-CM | POA: Diagnosis not present

## 2017-08-30 DIAGNOSIS — R7989 Other specified abnormal findings of blood chemistry: Secondary | ICD-10-CM | POA: Diagnosis not present

## 2017-08-30 DIAGNOSIS — B20 Human immunodeficiency virus [HIV] disease: Secondary | ICD-10-CM | POA: Diagnosis not present

## 2017-08-30 DIAGNOSIS — F40232 Fear of other medical care: Secondary | ICD-10-CM

## 2017-08-30 DIAGNOSIS — E440 Moderate protein-calorie malnutrition: Secondary | ICD-10-CM | POA: Diagnosis not present

## 2017-08-30 DIAGNOSIS — R55 Syncope and collapse: Secondary | ICD-10-CM

## 2017-08-30 DIAGNOSIS — B9561 Methicillin susceptible Staphylococcus aureus infection as the cause of diseases classified elsewhere: Secondary | ICD-10-CM

## 2017-08-30 LAB — RAPID URINE DRUG SCREEN, HOSP PERFORMED
AMPHETAMINES: NOT DETECTED
BARBITURATES: NOT DETECTED
Benzodiazepines: NOT DETECTED
COCAINE: NOT DETECTED
OPIATES: NOT DETECTED
TETRAHYDROCANNABINOL: NOT DETECTED

## 2017-08-30 LAB — HEPATITIS A ANTIBODY, TOTAL: HEPATITIS A AB,TOTAL: NONREACTIVE

## 2017-08-30 LAB — RPR: RPR Ser Ql: NONREACTIVE

## 2017-08-30 NOTE — Discharge Instructions (Signed)
You were admitted due to a fainting spell. This is believed to be due to your blood draw. We watched you overnight and deemed you well enough to go home. There are no changes to your medications. Please keep your follow up appointment this afternoon with your primary care physician.

## 2017-08-30 NOTE — Consult Note (Signed)
[facility] for Infectious Disease       Reason for Consult: HIV    Referring Physician: Dr. Vernon Prey  Active Problems:   Vasovagal syncope   . bictegravir-emtricitabine-tenofovir AF  1 tablet Oral Daily  . feeding supplement (ENSURE ENLIVE)  237 mL Oral BID BM  . ferrous sulfate  325 mg Oral Q breakfast  . levothyroxine  50 mcg Oral QAC breakfast  . sulfamethoxazole-trimethoprim  1 tablet Oral Daily    Recommendations: Continue Biktarvy, Bactrim Ok from ID standpoint for d/c   Assessment: He had a vasovagal response to blood draw, no post ictal period, doing well now.   hIV - newly diagnosed and tolerating current medication Staph aurues in urine - asymptomatic, no indication for treatment.   Antibiotics:   HPI: Hunter Graves is a 52 y.o. male with HIV, recently diagnosed on Biktarvy and had a vagal response with blood draw yesterday in clinic.  He has a known needle phobia.  No convulsions.  CD4 of 73.  On bactrim prophylaxis.  Feels well now and no dizziness, no lightheadedness, no concerns.  No associated cough.  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  Some chronic microvascular ischemic changes and volume loss.    Review of Systems:  Constitutional: negative for fevers, chills and sweats Respiratory: negative for cough or sputum Gastrointestinal: negative for nausea, vomiting and diarrhea Neurological: negative for gait problems and tremor All other systems reviewed and are negative    Past Medical History:  Diagnosis Date  . HIV (human immunodeficiency virus infection) ([place])   . Seizures ([place])    "think I had my 1st today; they are still not sure" (08/21/2017)  . Syncope and collapse    "think I had my 1st today; they are still not sure" (08/21/2017)    Social History   Tobacco Use  . Smoking status: Never Smoker  . Smokeless tobacco: Never Used  Substance Use Topics  . Alcohol use: Not Currently    Comment: 08/21/2017 "1 drink q 6  months or less"  . Drug use: Not Currently    PMH: HIV  No Known Allergies  Physical Exam: Constitutional: in no apparent distress, thin appearing Vitals:   08/29/17 2101 08/30/17 0627  BP: 119/77 113/82  Pulse: 97 66  Resp: 16 16  Temp: 98.2 F (36.8 C) 97.7 F (36.5 C)  SpO2: (!) 84% 98%   EYES: anicteric ENMT: no thrush Cardiovascular: Cor Tachy Respiratory: CTA B; normal respiratory effort GI: Bowel sounds are normal, liver is not enlarged, spleen is not enlarged Musculoskeletal: no pedal edema noted Skin: negatives: no lesions noted Hematologic: no cervical lad  Lab Results  Component Value Date   WBC 10.2 08/29/2017   HGB 10.6 (L) 08/29/2017   HCT 33.0 (L) 08/29/2017   MCV 78.0 08/29/2017   PLT 82 (L) 08/29/2017    Lab Results  Component Value Date   CREATININE 1.80 (H) 08/29/2017   BUN 17 08/29/2017   NA 141 08/29/2017   K 4.0 08/29/2017   CL 108 08/29/2017   CO2 23 08/29/2017    Lab Results  Component Value Date   ALT 12 (L) 08/24/2017   AST 18 08/24/2017   ALKPHOS 47 08/24/2017     Microbiology: Recent Results (from the past 240 hour(s))  Urine Culture     Status: Abnormal   Collection Time: 08/21/17  4:47 PM  Result Value Ref Range Status   Specimen Description URINE, RANDOM  Final  Special Requests   Final    NONE Performed at [facility], [place] [address] [place], [place] [address]    Culture 40,000 COLONIES/mL STAPHYLOCOCCUS AUREUS (A)  Final   Report Status 08/24/2017 FINAL  Final   Organism ID, Bacteria STAPHYLOCOCCUS AUREUS (A)  Final      Susceptibility   Staphylococcus aureus - MIC*    CIPROFLOXACIN <=0.5 SENSITIVE Sensitive     GENTAMICIN <=0.5 SENSITIVE Sensitive     NITROFURANTOIN <=16 SENSITIVE Sensitive     OXACILLIN 0.5 SENSITIVE Sensitive     TETRACYCLINE <=1 SENSITIVE Sensitive     VANCOMYCIN <=0.5 SENSITIVE Sensitive     TRIMETH/SULFA <=10 SENSITIVE Sensitive     CLINDAMYCIN RESISTANT Resistant     RIFAMPIN  <=0.5 SENSITIVE Sensitive     Inducible Clindamycin POSITIVE Resistant     * 40,000 COLONIES/mL STAPHYLOCOCCUS AUREUS    Thayer Headings, [employer] for Infectious Disease [place] [email or web address] [phone] pager  [phone] cell 08/30/2017, 10:31 AM

## 2017-08-30 NOTE — Progress Notes (Signed)
   Subjective:    Patient ID: Hunter Graves, male    DOB: 03/19/1966, 52 y.o.   MRN: [id number]  HPI New to establish.  Pt was admitted 5/28-5/31 for syncope.  Was discovered to have HIV at that time w/ low CD4 count placing him in the AIDS category.  Yesterday while at ID, he again had a syncopal episode while having his blood drawn.  Went to ER and was again admitted overnight (this d/c summary not complete yet for review).  1st syncopal event occurred at [facility]- was standing in line when he felt warm, flushed.  Sat down and next thing he remembers is being on the floor.  Pt was felt to have had a vasovagal episode.  TSH was low as was T4.  Started on Levothyroxine 67mcg daily.  Hgb was mildly low- started on Iron.  Pt reports yesterday he was feeling well until blood was drawn.  It was determined he did not have a seizure as originally feared- again felt it was a vasovagal episode.  Cr was increased to 1.8- pt admits to poor fluid intake today.  Pt doesn't recall having an issue w/ BP in the past.  Prior to admission, pt would feel hungry but would be full after 2-3 bites.  Appetite is better since hospital d/c.   Review of Systems For ROS see HPI     Objective:   Physical Exam  Constitutional: He is oriented to person, place, and time. He appears well-developed. No distress.  Cachectic  HENT:  Head: Normocephalic and atraumatic.  Eyes: Pupils are equal, round, and reactive to light. Conjunctivae and EOM are normal.  Neck: Normal range of motion. Neck supple. No thyromegaly present.  Cardiovascular: Normal rate, regular rhythm, normal heart sounds and intact distal pulses.  No murmur heard. Pulmonary/Chest: Effort normal and breath sounds normal. No respiratory distress.  Abdominal: Soft. Bowel sounds are normal. He exhibits no distension and no mass. There is no tenderness. There is no guarding.  Lymphadenopathy:    He has no cervical adenopathy.  Neurological: He is alert and oriented  to person, place, and time.  Skin: Skin is warm and dry.  Psychiatric: He has a normal mood and affect. His behavior is normal. Thought content normal.  Vitals reviewed.         Assessment & Plan:

## 2017-08-30 NOTE — Progress Notes (Signed)
Hunter Graves to be D/C'd Home per MD order.  Discussed with the patient and all questions fully answered.  VSS, Skin clean, dry and intact without evidence of skin break down, no evidence of skin tears noted. IV catheter discontinued intact. Site without signs and symptoms of complications. Dressing and pressure applied.  An After Visit Summary was printed and given to the patient. Patient received prescription.  D/c education completed with patient/family including follow up instructions, medication list, d/c activities limitations if indicated, with other d/c instructions as indicated by MD - patient able to verbalize understanding, all questions fully answered.   Patient instructed to return to ED, call 911, or call MD for any changes in condition.   Patient escorted via [place], and D/C home via private auto.  Luci Bank 08/30/2017 10:41 AM

## 2017-08-30 NOTE — Care Management Note (Signed)
Case Management Note  Patient Details  Name: Hunter Graves MRN: [id number] Date of Birth: [birth year]  Subjective/Objective:                    Action/Plan: Pt discharging home with self care. Pt has hospital f/u, insurance and transportation home.   Expected Discharge Date:  08/30/17               Expected Discharge Plan:  Home/Self Care  In-House Referral:     Discharge planning Services     Post Acute Care Choice:    Choice offered to:     DME Arranged:    DME Agency:     HH Arranged:    HH Agency:     Status of Service:  Completed, signed off  If discussed at [employer] of Stay Meetings, dates discussed:    Additional Comments:  Pollie Friar, RN 08/30/2017, 10:41 AM

## 2017-08-30 NOTE — Patient Instructions (Signed)
Follow up on June 17 or 18 to recheck BP Drink plenty of fluids Increase your salt intake ADD protein shakes at least once daily Make sure you are eating regularly- it's important! We'll call you with your thyroid ultrasound Call with any questions or concerns Hang in there!  We'll figure this out!

## 2017-09-01 NOTE — Discharge Summary (Signed)
[facility] Discharge Summary  Patient name: Hunter Graves Medical record number: [id number] Date of birth: [birth year] Age: 52 y.o. Gender: male Date of Admission: 08/29/2017  Date of Discharge: 08/30/2017 Admitting Physician: Dickie La, MD  Primary Care Provider: Midge Minium, MD Consultants: ID  Indication for Hospitalization: Syncope  Discharge Diagnoses/Problem List:  Syncope HIV Hypothyroidism Anemia CKD stage 3A  Disposition: home  Discharge Condition: stable  Discharge Exam: Constitutional: He is oriented to person, place, and time. No distress.  Cardiovascular: Normal rate and regular rhythm.  Pulmonary/Chest: Effort normal and breath sounds normal. No respiratory distress.  Abdominal: Soft. Bowel sounds are normal.  Musculoskeletal: Normal range of motion. He exhibits no deformity.  Neurological: He is alert and oriented to person, place, and time. No cranial nerve deficit or sensory deficit. He exhibits normal muscle tone. Coordination normal.  Skin: Skin is warm and dry. Capillary refill takes less than 2 seconds. No rash noted.  Psychiatric: He has a normal mood and affect. His behavior is normal.   Brief Hospital Course:  52 year old who presented for observation on 6/5. He was at his infectious disease specialist where he had blood drawn. He developed diaphoresis and had loss of consciousness. This is consisent with similar event which landed him in the hospital on 5/28 as well as an event that happened while he was in the hospital. He was observed overnight without any additional symptoms.  Elevated creatinine He was found to have an slightly elevated creatinine up to 1.8. Labs at that time consistent with stage 3 CKD. Thought to be due to his hiv medications. Would consider recheck in a few months.    Issues for Follow Up:  1. Keep follow up with pcp 2. continue to follow up with ID      Significant Procedures:  none  Significant Labs and Imaging:  Recent Labs  Lab 08/29/17 1644  WBC 10.2  HGB 10.6*  HCT 33.0*  PLT 82*   Recent Labs  Lab 08/29/17 1644  NA 141  K 4.0  CL 108  CO2 23  GLUCOSE 137*  BUN 17  CREATININE 1.80*  CALCIUM 8.6*  MG 1.6*     Results/Tests Pending at Time of Discharge:  Discharge Medications:  Allergies as of 08/30/2017   No Known Allergies     Medication List    STOP taking these medications   acetaminophen 500 MG tablet Commonly known as:  TYLENOL     TAKE these medications   bictegravir-emtricitabine-tenofovir AF 50-200-25 MG Tabs tablet Commonly known as:  BIKTARVY Take 1 tablet by mouth daily.   ferrous sulfate 325 (65 FE) MG tablet Take 1 tablet (325 mg total) by mouth daily with breakfast.   levothyroxine 50 MCG tablet Commonly known as:  SYNTHROID, LEVOTHROID Take 1 tablet (50 mcg total) by mouth daily before breakfast.   sulfamethoxazole-trimethoprim 800-160 MG tablet Commonly known as:  BACTRIM DS,SEPTRA DS Take 1 tablet by mouth daily.       Discharge Instructions: Please refer to Patient Instructions section of EMR for full details.  Patient was counseled important signs and symptoms that should prompt return to medical care, changes in medications, dietary instructions, activity restrictions, and follow up appointments.   Follow-Up Appointments:   Guadalupe Dawn, MD 09/01/2017, 12:56 PM PGY-1, [place]

## 2017-09-02 DIAGNOSIS — E46 Unspecified protein-calorie malnutrition: Secondary | ICD-10-CM | POA: Insufficient documentation

## 2017-09-02 NOTE — Assessment & Plan Note (Signed)
New dx for pt.  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  ID has placed him on Bactrim DS as prophylaxis.  Encouraged him to take his medication as directed and to f/u w/ ID on their strict schedule.  Pt expressed understanding and is in agreement w/ plan.

## 2017-09-02 NOTE — Assessment & Plan Note (Signed)
Pt had low TSH and low T4 on recent labs.  Based on this, will get thyroid US to assess for possible nodules.  Will need repeat TSH at next visit given his recent start of Levothyroxine 70mcg daily.  Pt expressed understanding and is in agreement w/ plan.

## 2017-09-02 NOTE — Assessment & Plan Note (Signed)
Pt is cachectic on exam.  Stressed need for regular protein intake.  Encouraged protein shakes.  Pt reports appetite has improved recently.  If he continues to have early satiety, we will need to investigate.  Pt expressed understanding and is in agreement w/ plan.

## 2017-09-02 NOTE — Assessment & Plan Note (Signed)
New to provider.  Reviewed both hospitalization notes and it appears that both episodes were dx'd as vasovagal events.  Encouraged increased fluids, increased salt intake, and changing positions slowly.  Pt will need to lie down prior to all blood draws.

## 2017-09-02 NOTE — Assessment & Plan Note (Signed)
Recent labs show that Cr is elevated.  Encouraged pt to increase fluid intake and we will repeat BMP at next visit.  Pt expressed understanding and is in agreement w/ plan.

## 2017-09-03 LAB — CRYPTOCOCCAL AG, LTX SCR RFLX TITER

## 2017-09-04 ENCOUNTER — Ambulatory Visit
Admission: RE | Admit: 2017-09-04 | Discharge: 2017-09-04 | Disposition: A | Discharge status: Home / Self Care | Payer: BC Managed Care – PPO | Source: Ambulatory Visit | Attending: Family Medicine | Admitting: Family Medicine

## 2017-09-04 ENCOUNTER — Telehealth: Payer: Self-pay | Admitting: Pharmacist

## 2017-09-04 DIAGNOSIS — B20 Human immunodeficiency virus [HIV] disease: Secondary | ICD-10-CM

## 2017-09-04 DIAGNOSIS — E038 Other specified hypothyroidism: Secondary | ICD-10-CM

## 2017-09-04 MED ORDER — BICTEGRAVIR-EMTRICITAB-TENOFOV 50-200-25 MG PO TABS: 1.0000 | ORAL_TABLET | Freq: Every day | ORAL | 8 refills | Status: DC

## 2017-09-04 NOTE — Telephone Encounter (Signed)
Patient called and stated that [facility] could not fill his Biktarvy. I had Inez Catalina do some investigation and he has to get [facility] which is required by his [employer].  I sent it to the appropriate pharmacy and called the patient with the number to the pharmacy.  He has ~2 weeks left of medication, so I told him to call them tomorrow to set everything up.  He knows to call me with any further issues.

## 2017-09-10 LAB — HIV-1 RNA ULTRAQUANT REFLEX TO GENTYP+
HIV 1 RNA Quant: 49400 copies/mL — ABNORMAL HIGH
HIV-1 RNA QUANT, LOG: 4.69 {Log_copies}/mL — AB

## 2017-09-10 LAB — HIV-1 GENOTYPE: HIV-1 Genotype: DETECTED — AB

## 2017-09-11 ENCOUNTER — Ambulatory Visit: Payer: BC Managed Care – PPO | Admitting: Family Medicine

## 2017-09-11 ENCOUNTER — Telehealth: Payer: Self-pay | Admitting: *Deleted

## 2017-09-11 ENCOUNTER — Encounter: Payer: Self-pay | Admitting: Family Medicine

## 2017-09-11 ENCOUNTER — Other Ambulatory Visit: Payer: Self-pay

## 2017-09-11 ENCOUNTER — Telehealth: Payer: Self-pay | Admitting: Nurse Practitioner

## 2017-09-11 VITALS — BP 112/78 | HR 78 | Temp 97.9°F | Resp 16 | Ht 70.0 in | Wt 129.2 lb

## 2017-09-11 DIAGNOSIS — D696 Thrombocytopenia, unspecified: Secondary | ICD-10-CM | POA: Diagnosis not present

## 2017-09-11 DIAGNOSIS — E038 Other specified hypothyroidism: Secondary | ICD-10-CM | POA: Diagnosis not present

## 2017-09-11 DIAGNOSIS — R7989 Other specified abnormal findings of blood chemistry: Secondary | ICD-10-CM | POA: Diagnosis not present

## 2017-09-11 DIAGNOSIS — I959 Hypotension, unspecified: Secondary | ICD-10-CM | POA: Diagnosis not present

## 2017-09-11 DIAGNOSIS — E440 Moderate protein-calorie malnutrition: Secondary | ICD-10-CM | POA: Diagnosis not present

## 2017-09-11 LAB — CBC WITH DIFFERENTIAL/PLATELET
BASOS ABS: 0 10*3/uL (ref 0.0–0.1)
Basophils Relative: 0.6 % (ref 0.0–3.0)
EOS ABS: 0.5 10*3/uL (ref 0.0–0.7)
Eosinophils Relative: 10.5 % — ABNORMAL HIGH (ref 0.0–5.0)
HCT: 34.7 % — ABNORMAL LOW (ref 39.0–52.0)
Hemoglobin: 11.7 g/dL — ABNORMAL LOW (ref 13.0–17.0)
LYMPHS ABS: 1.5 10*3/uL (ref 0.7–4.0)
Lymphocytes Relative: 32.2 % (ref 12.0–46.0)
MCHC: 33.8 g/dL (ref 30.0–36.0)
MCV: 80.3 fl (ref 78.0–100.0)
MONO ABS: 0.4 10*3/uL (ref 0.1–1.0)
Monocytes Relative: 9.3 % (ref 3.0–12.0)
NEUTROS ABS: 2.2 10*3/uL (ref 1.4–7.7)
NEUTROS PCT: 47.4 % (ref 43.0–77.0)
RBC: 4.32 Mil/uL (ref 4.22–5.81)
RDW: 19.1 % — ABNORMAL HIGH (ref 11.5–15.5)
WBC: 4.6 10*3/uL (ref 4.0–10.5)

## 2017-09-11 LAB — HEPATIC FUNCTION PANEL
ALT: 18 U/L (ref 0–53)
AST: 17 U/L (ref 0–37)
Albumin: 3.7 g/dL (ref 3.5–5.2)
Alkaline Phosphatase: 46 U/L (ref 39–117)
BILIRUBIN DIRECT: 0.1 mg/dL (ref 0.0–0.3)
BILIRUBIN TOTAL: 0.3 mg/dL (ref 0.2–1.2)
Total Protein: 8.1 g/dL (ref 6.0–8.3)

## 2017-09-11 LAB — BASIC METABOLIC PANEL
BUN: 20 mg/dL (ref 6–23)
CALCIUM: 9.2 mg/dL (ref 8.4–10.5)
CO2: 29 mEq/L (ref 19–32)
CREATININE: 1.82 mg/dL — AB (ref 0.40–1.50)
Chloride: 102 mEq/L (ref 96–112)
GFR: 50.8 mL/min — AB (ref >60.00)
GLUCOSE: 82 mg/dL (ref 70–99)
POTASSIUM: 4.2 meq/L (ref 3.5–5.1)
Sodium: 137 mEq/L (ref 135–145)

## 2017-09-11 LAB — T3, FREE: T3 FREE: 3.4 pg/mL (ref 2.3–4.2)

## 2017-09-11 LAB — T4, FREE: Free T4: 0.93 ng/dL (ref 0.60–1.60)

## 2017-09-11 LAB — TSH: TSH: 0.57 u[IU]/mL (ref 0.35–4.50)

## 2017-09-11 NOTE — Progress Notes (Signed)
   Subjective:    Patient ID: Hunter Graves, male    DOB: 1965/12/28, 52 y.o.   MRN: [id number]  HPI Hypotension- pt's BP is up to 112/78 today compared to 92/60.  Pt has been eating quite a bit of soup- which improves BP.  No dizziness, no feeling as if he's going to pass out.    Hypothyroid- pt was started on Levothyroxine 69mcg while in ER.  Fatigue is improving.  Denies changes to skin/hair/nails.  No palpitations.  Malnutrition- pt has gained 6 lbs since 6/6.  'i've been eating like crazy'.  Appetite has returned.  Early satiety has resolved.  Pt is drinking protein shakes regularly.  Energy level is improving.  Review of Systems For ROS see HPI     Objective:   Physical Exam  Constitutional: He is oriented to person, place, and time. He appears well-developed and well-nourished. No distress.  HENT:  Head: Normocephalic and atraumatic.  Eyes: Pupils are equal, round, and reactive to light. Conjunctivae and EOM are normal.  Neck: Normal range of motion. Neck supple. No thyromegaly present.  Cardiovascular: Normal rate, regular rhythm, normal heart sounds and intact distal pulses.  No murmur heard. Pulmonary/Chest: Effort normal and breath sounds normal. No respiratory distress.  Abdominal: Soft. Bowel sounds are normal. He exhibits no distension.  Musculoskeletal: He exhibits no edema.  Lymphadenopathy:    He has no cervical adenopathy.  Neurological: He is alert and oriented to person, place, and time. No cranial nerve deficit.  Skin: Skin is warm and dry.  Psychiatric: He has a normal mood and affect. His behavior is normal.  Vitals reviewed.         Assessment & Plan:

## 2017-09-11 NOTE — Telephone Encounter (Signed)
Reviewed chart. Has history of thrombocytopenia but this is significantly decreased from check last week. Please call patient to assess any significant bleeding noted, any blood in stool or melena? If not, would have Dr. Birdie Riddle review in the morning. He will most likely need referral to Hematology (Urgent) -- you can go ahead and place this while we wait to see if Dr. Birdie Riddle has anything to add. If he is symptomatic would recommend ER assessment.

## 2017-09-11 NOTE — Telephone Encounter (Signed)
Spoke with patient to advise of the notes below.  He stated verbal understanding. Patient states that he only questions it because he recently started the medication and now this has happened.  Advised that typically it does not affect platelets.  Patient stated verbal understanding and said that he would wait to hear if there is anything different that Dr. Birdie Riddle wants him to do.     Per referral Coordinator, patient is seeing Hematology this week.

## 2017-09-11 NOTE — Addendum Note (Signed)
Addended by: Katina Dung on: 09/11/2017 04:04 PM   Modules accepted: Orders

## 2017-09-11 NOTE — Assessment & Plan Note (Signed)
Improved today w/ SBP of 112.  He has increased his salt and water intake.  Applauded his efforts.  Currently asymptomatic.  Will continue to follow.

## 2017-09-11 NOTE — Assessment & Plan Note (Signed)
Pt has regained his appetite and has gained 6 lbs in 12 days.  He is drinking daily protein shakes as recommended.  Applauded his weight gain and his efforts to eat more and better.  Will continue to follow.

## 2017-09-11 NOTE — Assessment & Plan Note (Signed)
Repeat BMP now that pt is better hydrated.

## 2017-09-11 NOTE — Telephone Encounter (Signed)
Patient is calling and would like to know if any medication he is on could be causing his platelets to drop. Please contact. [phone]

## 2017-09-11 NOTE — Assessment & Plan Note (Signed)
Most likely due to his HIV dx.  Repeat CBC today.

## 2017-09-11 NOTE — Telephone Encounter (Signed)
Received a call from Levada Dy at [facility]  to schedule an urgent hematology appointment for the pt. Pt has been scheduled to see Lacie Burton/Dr.Feng on 6/19 at 130pm. Levada Dy will notify the pt.

## 2017-09-11 NOTE — Telephone Encounter (Signed)
I was just looking at medicine list. There is nothing he is taking that should cause this. The HIV medication can sometimes have effect on neutrophil count but should not affect platelets.  It is likely the HIV/AIDS itself that is the main culprit of the low platelets. We will know more once he sees the specialist

## 2017-09-11 NOTE — Assessment & Plan Note (Signed)
Pt was started on Levothyroxine while hospitalized in May.  His T4 and TSH were both low at that time.  Will repeat labs today and determine if med changes are needed or if further workup is required.  Pt expressed understanding and is in agreement w/ plan.

## 2017-09-11 NOTE — Patient Instructions (Signed)
Follow up in 3 months to recheck BP and weight gain We'll notify you of your lab results and make any changes if needed Continue to drink lots of fluids!  Water and gatorade are best! KEEP UP THE GOOD WORK ON THE EATING AND PROTEIN SHAKES!!!  I'm SO proud of you! Call with any questions or concerns You've got this!

## 2017-09-11 NOTE — Telephone Encounter (Addendum)
CRITICAL VALUE STICKER  CRITICAL VALUE: Platelet Count low at 96  RECEIVER (on-site recipient of call): Baby Gieger  /DATE & TIME NOTIFIED: 09/11/17 3:38pm / MESSENGER (representative from lab): Hope  MD NOTIFIED: Hassell Done  TIME OF NOTIFICATION:3:40pm

## 2017-09-11 NOTE — Telephone Encounter (Signed)
I have spoken with patient regarding his platelet count.  He is not currently having any symptoms/no bleeding.  He is aware that if he is experiences anything he is to go to the ER.   Patient stated verbal understanding.   Patient is also aware that I am placing Urgent referral to Hematology for patient to be seen.   Referral Coordinator is working on this.    Will await further instructions from PCP.

## 2017-09-12 ENCOUNTER — Encounter: Payer: Self-pay | Admitting: General Practice

## 2017-09-13 ENCOUNTER — Inpatient Hospital Stay: Payer: BC Managed Care – PPO | Attending: Nurse Practitioner | Admitting: Nurse Practitioner

## 2017-09-13 ENCOUNTER — Telehealth: Payer: Self-pay | Admitting: Nurse Practitioner

## 2017-09-13 ENCOUNTER — Other Ambulatory Visit: Payer: Self-pay | Admitting: Family Medicine

## 2017-09-13 ENCOUNTER — Encounter: Payer: Self-pay | Admitting: Nurse Practitioner

## 2017-09-13 VITALS — BP 125/91 | HR 108 | Temp 98.4°F | Resp 17 | Ht 70.0 in | Wt 129.5 lb

## 2017-09-13 DIAGNOSIS — D696 Thrombocytopenia, unspecified: Secondary | ICD-10-CM | POA: Insufficient documentation

## 2017-09-13 DIAGNOSIS — N189 Chronic kidney disease, unspecified: Secondary | ICD-10-CM | POA: Insufficient documentation

## 2017-09-13 DIAGNOSIS — B20 Human immunodeficiency virus [HIV] disease: Secondary | ICD-10-CM | POA: Insufficient documentation

## 2017-09-13 DIAGNOSIS — D61818 Other pancytopenia: Secondary | ICD-10-CM

## 2017-09-13 DIAGNOSIS — Z803 Family history of malignant neoplasm of breast: Secondary | ICD-10-CM

## 2017-09-13 DIAGNOSIS — N183 Chronic kidney disease, stage 3 (moderate): Secondary | ICD-10-CM

## 2017-09-13 DIAGNOSIS — D509 Iron deficiency anemia, unspecified: Secondary | ICD-10-CM

## 2017-09-13 NOTE — Telephone Encounter (Signed)
Copied from [place] [id number]. Topic: Quick Communication - Rx Refill/Question >> Sep 13, 2017 12:05 PM Scherrie Gerlach wrote: Medication: levothyroxine (SYNTHROID, LEVOTHROID) 50 MCG tablet sulfamethoxazole-trimethoprim (BACTRIM DS,SEPTRA DS) 800-160 MG tablet ferrous sulfate 325 (65 FE) MG tablet  [id number] [address], [place]. [phone] (Phone) [phone] (Fax)  No refills and pt advised to call his pharmacy next time

## 2017-09-13 NOTE — Progress Notes (Addendum)
[place]  [phone] Fax:([phone]) [place] consult Note   Patient Care Team: Midge Minium, MD as PCP - General (Family Medicine) 09/13/2017  CHIEF COMPLAINTS/PURPOSE OF CONSULTATION:  Thrombocytopenia   HISTORY OF PRESENTING ILLNESS:  Hunter Graves 52 y.o. male is here because of worsening thrombocytopenia. He was referred by PCP Dr. Birdie Riddle. He was found to have abnormal CBC from 08/21/17. It is unclear how long the problem existed prior to that. He presented to ER on 08/21/17 after unwitnessed syncopal episode. He was admitted for thorough work up and found to have microcytic anemia Hgb 11, leukopenia, and thrombocytopenia plt 82K. Iron studies revealed low serum iron and percent saturation but normal TIBC and ferritin. He was started on oral iron 1 tab daily. CMP showed CKD stage 3. SPEP was negative for M spike. He tested positive on routine HIV screen. He does not know how long ago he contracted the virus. CD4 count of 73. He was seen by ID and started on Biktarvy and bactrim for opportunistic infection prophylaxis. D-dimer was elevated, CTA was negative for lung mass or PE; brain MRI suspicious for pituitary microadenoma. He is on synthroid. He had another syncopal event attributed to convulsive vasovagal response to blood draw and was evaluated in the ED on 08/29/17.   He denies heavy NSAID or anticoagulation use. No easy bruising or bleeding such as epistaxis, hematuria, or hematochezia. Denies personal history of cancer, autoimmune disorder, skin rash, joint pain, or inflammatory bowel disease. He denies colonoscopy or routine medical care. Has never had a blood transfusion. 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 Before hospitalization be lead an active lifestyle. Denies current drug use, no tobacco use. He drinks alcohol occasionally. Strong family history of breast cancer in 3 maternal aunts (2 alive, 1 deceased with  inflammatory breast cancer) and 1 maternal cousin who was diagnosed in her 78's who is alive.  Prior to hospitalization in 07/2017 he reports 20 lbs weight loss, occasionally drenching night sweats, and productive cough that began 06/2017. Since discharge and starting HIV medication, night sweats are gone and cough is much improved. Denies fever, chest pain, dyspnea, hemoptysis.   MEDICAL HISTORY:  Past Medical History:  Diagnosis Date  . Chicken pox   . HIV (human immunodeficiency virus infection) ([place])   . Seizures ([place])    "think I had my 1st today; they are still not sure" (08/21/2017)  . Syncope and collapse    "think I had my 1st today; they are still not sure" (08/21/2017)  . Thyroid disease     SURGICAL HISTORY: Past Surgical History:  Procedure Laterality Date  . NO PAST SURGERIES      SOCIAL HISTORY: Social History   Socioeconomic History  . Marital status: Single    Spouse name: Not on file  . Number of children: Not on file  . Years of education: Not on file  . Highest education level: Not on file  Occupational History  . Not on file  Social Needs  . Financial resource strain: Not on file  . Food insecurity:    Worry: Not on file    Inability: Not on file  . Transportation needs:    Medical: Not on file    Non-medical: Not on file  Tobacco Use  . Smoking status: Never Smoker  . Smokeless tobacco: Never Used  Substance and Sexual Activity  . Alcohol use: Not Currently    Comment:  08/21/2017 "1 drink q 6 months or less"  . Drug use: Not Currently  . Sexual activity: Not Currently  Lifestyle  . Physical activity:    Days per week: Not on file    Minutes per session: Not on file  . Stress: Not on file  Relationships  . Social connections:    Talks on phone: Not on file    Gets together: Not on file    Attends religious service: Not on file    Active member of club or organization: Not on file    Attends meetings of clubs or organizations: Not on file     Relationship status: Not on file  . Intimate partner violence:    Fear of current or ex partner: Not on file    Emotionally abused: Not on file    Physically abused: Not on file    Forced sexual activity: Not on file  Other Topics Concern  . Not on file  Social History Narrative  . Not on file    FAMILY HISTORY: Family History  Problem Relation Age of Onset  . Arthritis Mother   . Thyroid disease Mother   . Hypertension Mother   . Depression Mother   . Arthritis Maternal Grandmother   . Dementia Maternal Grandmother   . Diabetes Maternal Grandfather   . Hypertension Maternal Grandfather   . Emphysema Maternal Grandfather   . Cancer Maternal Aunt        breast  . Cancer Maternal Aunt 52       inflammatory breast cancer  . Cancer Maternal Aunt        breast cancer  . Cancer Cousin 40       breast    ALLERGIES:  has No Known Allergies.  MEDICATIONS:  Current Outpatient Medications  Medication Sig Dispense Refill  . bictegravir-emtricitabine-tenofovir AF (BIKTARVY) 50-200-25 MG TABS tablet Take 1 tablet by mouth daily. 30 tablet 8  . ferrous sulfate 325 (65 FE) MG tablet Take 1 tablet (325 mg total) by mouth daily with breakfast. 30 tablet 0  . levothyroxine (SYNTHROID, LEVOTHROID) 50 MCG tablet Take 1 tablet (50 mcg total) by mouth daily before breakfast. 30 tablet 0  . sulfamethoxazole-trimethoprim (BACTRIM DS,SEPTRA DS) 800-160 MG tablet Take 1 tablet by mouth daily. 30 tablet 0   No current facility-administered medications for this visit.     REVIEW OF SYSTEMS:   Constitutional: Denies fevers, chills (+) abnormal night sweats, now resolved (+) unintentional weight loss  Eyes: Denies blurriness of vision, double vision or watery eyes Ears, nose, mouth, throat, and face: Denies mucositis, sore throat, or epistaxis  Respiratory: Denies dyspnea or wheezes (+) occasional productive cough, improved  Cardiovascular: Denies palpitation, chest discomfort or lower  extremity swelling Gastrointestinal:  Denies nausea, vomiting, constipation, diarrhea, hematochezia, heartburn or change in bowel habits GU: Denies hematuria  Skin: Denies abnormal skin rashes (+) chronic hyperpigmentation to face and anterior arms Lymphatics: Denies new lymphadenopathy or easy bruising Neurological:Denies numbness, tingling or new weaknesses Behavioral/Psych: Mood is stable, no new changes  MSK: Denies bone/joint pain  All other systems were reviewed with the patient and are negative.  PHYSICAL EXAMINATION: ECOG PERFORMANCE STATUS: 1 - Symptomatic but completely ambulatory BP (!) 125/91 (BP Location: Left Arm, Patient Position: Sitting)   Pulse (!) 108   Temp 98.4 F (36.9 C) (Oral)   Resp 17   Ht 5' 10"  (1.778 m)   Wt 129 lb 8 oz (58.7 kg)   SpO2 100%  BMI 18.58 kg/m   GENERAL:alert, no distress and comfortable SKIN: no rashes or significant lesions EYES: normal, conjunctiva are pink and non-injected, sclera clear OROPHARYNX:no thrush or ulcers  LYMPH:  no palpable cervical, supraclavicular, axillary, or inguinal lymphadenopathy LUNGS: clear to auscultation and percussion with normal breathing effort HEART: regular rate & rhythm and no murmurs and no lower extremity edema ABDOMEN:abdomen soft, non-tender and normal bowel sounds. No hepatosplenomegaly  Musculoskeletal:no cyanosis of digits and no clubbing  PSYCH: alert & oriented x 3 with fluent speech NEURO: no focal motor/sensory deficits  LABORATORY DATA:  I have reviewed the data as listed CBC Latest Ref Rng & Units 09/11/2017 08/29/2017 08/24/2017  WBC 4.0 - 10.5 K/uL 4.6 10.2 3.6(L)  Hemoglobin 13.0 - 17.0 g/dL 11.7(L) 10.6(L) 11.2(L)  Hematocrit 39.0 - 52.0 % 34.7(L) 33.0(L) 34.9(L)  Platelets 150.0 - 400.0 K/uL 46.0 Repeated and verified X2. No platelet clumps seen on smear.(LL) 82(L) 85(L)    CMP Latest Ref Rng & Units 09/11/2017 08/29/2017 08/24/2017  Glucose 70 - 99 mg/dL 82 137(H) -  BUN 6 - 23  mg/dL 20 17 -  Creatinine 0.40 - 1.50 mg/dL 1.82(H) 1.80(H) 1.50(H)  Sodium 135 - 145 mEq/L 137 141 -  Potassium 3.5 - 5.1 mEq/L 4.2 4.0 -  Chloride 96 - 112 mEq/L 102 108 -  CO2 19 - 32 mEq/L 29 23 -  Calcium 8.4 - 10.5 mg/dL 9.2 8.6(L) -  Total Protein 6.0 - 8.3 g/dL 8.1 - 9.4(H)  Total Bilirubin 0.2 - 1.2 mg/dL 0.3 - 0.5  Alkaline Phos 39 - 117 U/L 46 - 47  AST 0 - 37 U/L 17 - 18  ALT 0 - 53 U/L 18 - 12(L)     RADIOGRAPHIC STUDIES: I have personally reviewed the radiological images as listed and agreed with the findings in the report. Dg Chest 2 View  Result Date: 08/21/2017 CLINICAL DATA:  Syncopal episode and possible seizure this morning. Patient reports productive cough for the past 3-4 weeks. No current symptoms. EXAM: CHEST - 2 VIEW COMPARISON:  None in PACs FINDINGS: The lungs are adequately inflated and clear. The heart and mediastinal structures are normal. The trachea is midline. There is no pleural effusion. The bony thorax exhibits no acute abnormality. IMPRESSION: There is no pneumonia nor other acute cardiopulmonary abnormality. Electronically Signed   By: David  Martinique M.D.   On: 08/21/2017 13:13   Ct Head Wo Contrast  Result Date: 08/21/2017 CLINICAL DATA:  Syncopal episode EXAM: CT HEAD WITHOUT CONTRAST TECHNIQUE: Contiguous axial images were obtained from the base of the skull through the vertex without intravenous contrast. COMPARISON:  None. FINDINGS: Brain: No evidence of acute infarction, hemorrhage, hydrocephalus, extra-axial collection or mass lesion/mass effect. Vascular: No hyperdense vessel or unexpected calcification. Skull: Normal. Negative for fracture or focal lesion. Sinuses/Orbits: Opacification of the maxillary sinuses is noted with partial opacification of the sphenoid sinus on the right and mucosal thickening in the ethmoid sinuses bilaterally. Other: None. IMPRESSION: No acute intracranial abnormality is noted. Diffuse sinus disease of uncertain  chronicity. Electronically Signed   By: Inez Catalina M.D.   On: 08/21/2017 13:39   Ct Angio Chest Pe W/cm &/or Wo Cm  Result Date: 08/21/2017 CLINICAL DATA:  Syncopal episode today EXAM: CT ANGIOGRAPHY CHEST WITH CONTRAST TECHNIQUE: Multidetector CT imaging of the chest was performed using the standard protocol during bolus administration of intravenous contrast. Multiplanar CT image reconstructions and MIPs were obtained to evaluate the vascular anatomy. CONTRAST:  75  cc Isovue 370 COMPARISON:  Chest radiography same day FINDINGS: Cardiovascular: Pulmonary arterial opacification is good. No pulmonary emboli. No aortic atherosclerosis. No coronary artery calcification. No cardiomegaly. No pericardial fluid. Mediastinum/Nodes: No mass or lymphadenopathy Lungs/Pleura: Bronchial thickening pattern centrally and in the lower lobes that could go along with bronchitis or asthma. Upper lungs are clear. No consolidation or collapse. No pleural effusion. Upper Abdomen: Negative Musculoskeletal: Negative Review of the MIP images confirms the above findings. IMPRESSION: No pulmonary emboli. Pattern of bronchial thickening in the perihilar regions and lower lobes that could go along with bronchitis or asthma. Electronically Signed   By: Nelson Chimes M.D.   On: 08/21/2017 16:19   Mr Jeri Cos BS Contrast  Result Date: 08/22/2017 CLINICAL DATA:  52 y/o M; pituitary/endocrine dysfunction, evaluate for pituitary adenoma. EXAM: MRI HEAD WITHOUT AND WITH CONTRAST TECHNIQUE: Multiplanar, multiecho pulse sequences of the brain and surrounding structures were obtained without and with intravenous contrast. Pituitary protocol. CONTRAST:  18m MULTIHANCE GADOBENATE DIMEGLUMINE 529 MG/ML IV SOLN COMPARISON:  08/21/2017 CT head FINDINGS: Pituitary: 2 mm late enhancing focus within the left aspect of the pituitary gland (series 1300-1305 image 7). No invasion of surrounding structures. Midline infundibulum. Brain: No acute infarction,  hemorrhage, hydrocephalus, extra-axial collection or mass lesion. Few scattered nonspecific foci of T2 FLAIR hyperintense signal abnormality in subcortical and periventricular white matter are compatible with mild chronic microvascular ischemic changes for age. Mild brain parenchymal volume loss. No abnormal enhancement of brain parenchyma. Vascular: Normal flow voids. Skull and upper cervical spine: Normal marrow signal. Sinuses/Orbits: Opacification of the right sphenoid and bilateral maxillary sinuses with partial opacification of the right-sided ethmoid air cells. Mild diffuse mucosal thickening of frontal sinuses and left ethmoid air cells. Other: Orbits are unremarkable. IMPRESSION: 1. 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 2. Mild chronic microvascular ischemic changes and parenchymal volume loss of the brain. 3. Extensive paranasal sinus opacification. Electronically Signed   By: LKristine GarbeM.D.   On: 08/22/2017 23:10   [place]  Result Date: 09/04/2017 CLINICAL DATA:  Goiter. EXAM: THYROID ULTRASOUND TECHNIQUE: Ultrasound examination of the thyroid gland and adjacent soft tissues was performed. COMPARISON:  None. FINDINGS: Parenchymal Echotexture: Normal Isthmus: 0.4 cm Right lobe: 4.3 x 1.5 x 1.6 cm Left lobe: 4.7 x 1.1 x 1.5 cm _________________________________________________________ Estimated total number of nodules >/= 1 cm: 0 Number of spongiform nodules >/=  2 cm not described below (TR1): 0 Number of mixed cystic and solid nodules >/= 1.5 cm not described below (TR2): 0 _________________________________________________________ No discrete nodules are seen within the thyroid gland. IMPRESSION: Normal thyroid ultrasound. The above is in keeping with the ACR TI-RADS recommendations - J Am Coll Radiol 2017;14:587-595. Electronically Signed   By: GAletta EdouardM.D.   On: 09/04/2017 15:16    ASSESSMENT & PLAN: JTAKSH HJORTis a pleasant 52year old AAM with no significant PMH found to have HIV, CKD, and pancytopenia on work up for syncopal episode   1. Pancytopenia  -We reviewed his medical chart in detail with the patient and his family.  -He has a mild microcytic, hypochromic anemia with thrombocytopenia that dropped significantly from 85K in 07/2017 to 46K in 09/11/17. White count has fluctuated in and out of normal range.  -Dr. FBurr Medicoreviewed etiologies of hematologic discrepancies. His worsening platelet count could certainly be related to his HIV status. Hepatitis panel was normal, recent transaminases and bili are normal.  He is not heavy alcohol drinker, this is not likely related to alcoholism or liver disease, but will obtain US of liver and spleen to rule out pathology.  -Dr. Burr Medico reviewed autoimmune cause of low platelet count such as ITP which may have arisen in the setting of his chronic infection, but would likely not require treatment until platelet count drops below 30K or he develops bleeding. He was instructed to avoid NSAIDS; will repeat CBC in 2 weeks to confirm plt count  -If his [place] is negative and platelet count does not improve on HIV treatment, may consider bone marrow biopsy to rule out bone marrow disorder as the cause of his cytopenias.  -Dr. Burr Medico reviewed he is at high risk for certain malignancies due to his HIV status, such as lymphoma. Lymphocytes are normal lately. Will monitor.  -iron studies reveal normal ferritin and TIBC, mildly low serum iron of 20 and 8% saturation; which could be reactive to his infection; he is on 1 oral iron tab daily.  -will f/u in 2 months or sooner depending on work up  2. HIV/AIDS infection  -Diagnosed 08/22/17, CD4 count 73 compliant with Biktarvy, tolerating well. On bactrim prophylaxis. Followed by Dr. Baxter Flattery, [place]  3. CKD  -Found on hospital work up, Cr 1.4 - 1.8 -Followed by PCP Dr. Birdie Riddle    PLAN: -Labs and medical record reviewed -Repeat labs  in 2 weeks  -US liver and spleen -F/u in 2 months or sooner depending on work up   All questions were answered. The patient knows to call the clinic with any problems, questions or concerns.     Alla Feeling, NP 09/13/17   Addendum  I have seen the patient, examined him. I agree with the assessment and and plan and have edited the notes.   Hunter Graves is a 52 yo male with PMH of recently diagnosed HIV, CKD, and was found to have worsening thrombocytopenia and mild anemia.  Her platelet count was 85K a months ago, and recently dropped to 46K, no clinical bleeding or bruising.  Hepatitis C and B test were negative, will get a abdominal ultrasound to evaluate his liver and spleen.  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 I recommend close clinical monitoring. If plt continue to drop significantly, will get a bone marrow biopsy also to rule out HIV related lymphoma. If plt<30K, or develops bleeding, and BM negaive, then I would consider treatment, such as steroid or TPO analogue.   Truitt Merle  09/16/2017

## 2017-09-13 NOTE — Telephone Encounter (Signed)
Scheduled appt per 6/20 los - gave patient aVS and calender per los.  

## 2017-09-14 MED ORDER — SULFAMETHOXAZOLE-TRIMETHOPRIM 800-160 MG PO TABS: 1.0000 | ORAL_TABLET | Freq: Every day | ORAL | 6 refills | Status: DC

## 2017-09-14 MED ORDER — FERROUS SULFATE 325 (65 FE) MG PO TABS: 325.0000 mg | ORAL_TABLET | Freq: Every day | ORAL | 6 refills | Status: DC

## 2017-09-14 MED ORDER — LEVOTHYROXINE SODIUM 50 MCG PO TABS: 50.0000 ug | ORAL_TABLET | Freq: Every day | ORAL | 6 refills | Status: DC

## 2017-09-14 NOTE — Telephone Encounter (Signed)
Please advise ok to refill medications? These are all pended. Please advise on the qty?

## 2017-09-14 NOTE — Telephone Encounter (Signed)
Refills by historical provider  Synthroid Bactrim DS FE  All with [facility] 08/24/17  #30  0 refills

## 2017-09-20 ENCOUNTER — Inpatient Hospital Stay (INDEPENDENT_AMBULATORY_CARE_PROVIDER_SITE_OTHER): Payer: BC Managed Care – PPO | Admitting: Physician Assistant

## 2017-09-21 ENCOUNTER — Ambulatory Visit (HOSPITAL_COMMUNITY)
Admission: RE | Admit: 2017-09-21 | Discharge: 2017-09-21 | Disposition: A | Discharge status: Home / Self Care | Payer: BC Managed Care – PPO | Source: Ambulatory Visit | Attending: Nurse Practitioner | Admitting: Nurse Practitioner

## 2017-09-21 DIAGNOSIS — D696 Thrombocytopenia, unspecified: Secondary | ICD-10-CM | POA: Diagnosis present

## 2017-09-25 ENCOUNTER — Encounter: Payer: Self-pay | Admitting: *Deleted

## 2017-09-25 ENCOUNTER — Inpatient Hospital Stay: Payer: BC Managed Care – PPO | Attending: Nurse Practitioner

## 2017-09-25 DIAGNOSIS — D696 Thrombocytopenia, unspecified: Secondary | ICD-10-CM | POA: Diagnosis present

## 2017-09-25 DIAGNOSIS — D509 Iron deficiency anemia, unspecified: Secondary | ICD-10-CM

## 2017-09-25 LAB — CMP (CANCER CENTER ONLY)
ALBUMIN: 3.6 g/dL (ref 3.5–5.0)
ALT: 24 U/L (ref 0–44)
ANION GAP: 4 — AB (ref 5–15)
AST: 21 U/L (ref 15–41)
Alkaline Phosphatase: 62 U/L (ref 38–126)
BUN: 19 mg/dL (ref 6–20)
CO2: 28 mmol/L (ref 22–32)
Calcium: 9.4 mg/dL (ref 8.9–10.3)
Chloride: 107 mmol/L (ref 98–111)
Creatinine: 1.75 mg/dL — ABNORMAL HIGH (ref 0.61–1.24)
GFR, EST AFRICAN AMERICAN: 51 mL/min — AB (ref >60)
GFR, Estimated: 44 mL/min — ABNORMAL LOW (ref >60)
Glucose, Bld: 98 mg/dL (ref 70–99)
POTASSIUM: 4 mmol/L (ref 3.5–5.1)
SODIUM: 139 mmol/L (ref 135–145)
TOTAL PROTEIN: 8.7 g/dL — AB (ref 6.5–8.1)
Total Bilirubin: 0.3 mg/dL (ref 0.3–1.2)

## 2017-09-25 LAB — IRON AND TIBC
Iron: 144 ug/dL (ref 42–163)
SATURATION RATIOS: 46 % (ref 42–163)
TIBC: 313 ug/dL (ref 202–409)
UIBC: 169 ug/dL

## 2017-09-25 LAB — CBC WITH DIFFERENTIAL (CANCER CENTER ONLY)
BASOS PCT: 1 %
Basophils Absolute: 0 10*3/uL (ref 0.0–0.1)
EOS ABS: 0.4 10*3/uL (ref 0.0–0.5)
EOS PCT: 8 %
HCT: 37.5 % — ABNORMAL LOW (ref 38.4–49.9)
Hemoglobin: 12.2 g/dL — ABNORMAL LOW (ref 13.0–17.1)
LYMPHS ABS: 1.4 10*3/uL (ref 0.9–3.3)
Lymphocytes Relative: 30 %
MCH: 27 pg — AB (ref 27.2–33.4)
MCHC: 32.4 g/dL (ref 32.0–36.0)
MCV: 83.2 fL (ref 79.3–98.0)
MONO ABS: 0.5 10*3/uL (ref 0.1–0.9)
MONOS PCT: 12 %
Neutro Abs: 2.4 10*3/uL (ref 1.5–6.5)
Neutrophils Relative %: 49 %
Platelet Count: 67 10*3/uL — ABNORMAL LOW (ref 140–400)
RBC: 4.5 MIL/uL (ref 4.20–5.82)
RDW: 21 % — AB (ref 11.0–14.6)
WBC Count: 4.7 10*3/uL (ref 4.0–10.3)

## 2017-09-25 LAB — FERRITIN: FERRITIN: 80 ng/mL (ref 24–336)

## 2017-09-28 ENCOUNTER — Telehealth: Payer: Self-pay

## 2017-09-28 NOTE — Telephone Encounter (Signed)
Spoke with patient per Cira Rue NP notified us results, liver and spleen within normal limits.  Patient verbalized an understanding

## 2017-09-28 NOTE — Telephone Encounter (Signed)
-----   Message from Truitt Merle, MD sent at 09/28/2017  8:11 AM EDT ----- Hunter Graves, please let pt know his [place] result, thanks   Truitt Merle  09/28/2017

## 2017-10-02 ENCOUNTER — Telehealth: Payer: Self-pay

## 2017-10-02 NOTE — Telephone Encounter (Signed)
-----   Message from Alla Feeling, NP sent at 10/01/2017  5:09 PM EDT ----- Please let patient know anemia and iron studies have responded well to oral iron. Continue current dose. Will f/u with him next month as scheduled.  Thanks, Regan Rakers NP

## 2017-10-02 NOTE — Telephone Encounter (Signed)
Spoke with patient per Cira Rue NP anemia and iron studies have responded well to the oral iron.  Instructed patient to continue current dose and we will see him next month as scheduled. Patient verbalized an understanding.

## 2017-10-15 ENCOUNTER — Other Ambulatory Visit: Payer: BC Managed Care – PPO

## 2017-10-15 ENCOUNTER — Other Ambulatory Visit: Payer: Self-pay | Admitting: *Deleted

## 2017-10-15 DIAGNOSIS — B20 Human immunodeficiency virus [HIV] disease: Secondary | ICD-10-CM

## 2017-10-16 LAB — CBC WITH DIFFERENTIAL/PLATELET
BASOS ABS: 31 {cells}/uL (ref 0–200)
Basophils Relative: 0.6 %
EOS PCT: 9.1 %
Eosinophils Absolute: 473 cells/uL (ref 15–500)
HCT: 39 % (ref 38.5–50.0)
Hemoglobin: 13.2 g/dL (ref 13.2–17.1)
Lymphs Abs: 1617 cells/uL (ref 850–3900)
MCH: 28.1 pg (ref 27.0–33.0)
MCHC: 33.8 g/dL (ref 32.0–36.0)
MCV: 83.2 fL (ref 80.0–100.0)
MONOS PCT: 8 %
NEUTROS ABS: 2662 {cells}/uL (ref 1500–7800)
NEUTROS PCT: 51.2 %
PLATELETS: 65 10*3/uL — AB (ref 140–400)
RBC: 4.69 10*6/uL (ref 4.20–5.80)
RDW: 18.5 % — ABNORMAL HIGH (ref 11.0–15.0)
Total Lymphocyte: 31.1 %
WBC mixed population: 416 cells/uL (ref 200–950)
WBC: 5.2 10*3/uL (ref 3.8–10.8)

## 2017-10-16 LAB — COMPLETE METABOLIC PANEL WITH GFR
AG Ratio: 0.8 (calc) — ABNORMAL LOW (ref 1.0–2.5)
ALKALINE PHOSPHATASE (APISO): 59 U/L (ref 40–115)
ALT: 63 U/L — AB (ref 9–46)
AST: 47 U/L — ABNORMAL HIGH (ref 10–35)
Albumin: 3.9 g/dL (ref 3.6–5.1)
BUN/Creatinine Ratio: 8 (calc) (ref 6–22)
BUN: 17 mg/dL (ref 7–25)
CHLORIDE: 103 mmol/L (ref 98–110)
CO2: 27 mmol/L (ref 20–32)
Calcium: 9.4 mg/dL (ref 8.6–10.3)
Creat: 2.14 mg/dL — ABNORMAL HIGH (ref 0.70–1.33)
GFR, Est African American: 40 mL/min/{1.73_m2} — ABNORMAL LOW (ref >=60)
GFR, Est Non African American: 35 mL/min/{1.73_m2} — ABNORMAL LOW (ref >=60)
GLUCOSE: 95 mg/dL (ref 65–99)
Globulin: 4.7 g/dL (calc) — ABNORMAL HIGH (ref 1.9–3.7)
Potassium: 4.7 mmol/L (ref 3.5–5.3)
Sodium: 135 mmol/L (ref 135–146)
Total Bilirubin: 0.3 mg/dL (ref 0.2–1.2)
Total Protein: 8.6 g/dL — ABNORMAL HIGH (ref 6.1–8.1)

## 2017-10-16 LAB — T-HELPER CELL (CD4) - (RCID CLINIC ONLY)
CD4 T CELL HELPER: 11 % — AB (ref 33–55)
CD4 T Cell Abs: 180 /uL — ABNORMAL LOW (ref 400–2700)

## 2017-10-17 LAB — HIV-1 RNA QUANT-NO REFLEX-BLD
HIV 1 RNA Quant: 88 copies/mL — ABNORMAL HIGH
HIV-1 RNA QUANT, LOG: 1.94 {Log_copies}/mL — AB

## 2017-10-29 ENCOUNTER — Ambulatory Visit: Payer: BC Managed Care – PPO | Admitting: Internal Medicine

## 2017-10-31 ENCOUNTER — Ambulatory Visit: Payer: BC Managed Care – PPO | Admitting: Internal Medicine

## 2017-11-05 ENCOUNTER — Ambulatory Visit: Payer: BC Managed Care – PPO | Admitting: Internal Medicine

## 2017-11-13 ENCOUNTER — Inpatient Hospital Stay: Payer: BC Managed Care – PPO

## 2017-11-13 ENCOUNTER — Inpatient Hospital Stay: Payer: BC Managed Care – PPO | Attending: Nurse Practitioner | Admitting: Nurse Practitioner

## 2017-11-13 NOTE — Progress Notes (Deleted)
Hunter Graves  [phone] [phone]  Clinic Follow up Note   Patient Care Team: Midge Minium, MD as PCP - General (Family Medicine) 11/13/2017  DIAGNOSIS: thrombocytopenia   CURRENT THERAPY:    INTERVAL HISTORY: Please see below for problem oriented charting.  REVIEW OF SYSTEMS:   Constitutional: Denies fevers, chills or abnormal weight loss Eyes: Denies blurriness of vision Ears, nose, mouth, throat, and face: Denies mucositis or sore throat Respiratory: Denies cough, dyspnea or wheezes Cardiovascular: Denies palpitation, chest discomfort or lower extremity swelling Gastrointestinal:  Denies nausea, heartburn or change in bowel habits Skin: Denies abnormal skin rashes Lymphatics: Denies new lymphadenopathy or easy bruising Neurological:Denies numbness, tingling or new weaknesses Behavioral/Psych: Mood is stable, no new changes  All other systems were reviewed with the patient and are negative.  MEDICAL HISTORY:  Past Medical History:  Diagnosis Date  . Chicken pox   . HIV (human immunodeficiency virus infection) ([place])   . Seizures ([place])    "think I had my 1st today; they are still not sure" (08/21/2017)  . Syncope and collapse    "think I had my 1st today; they are still not sure" (08/21/2017)  . Thyroid disease     SURGICAL HISTORY: Past Surgical History:  Procedure Laterality Date  . NO PAST SURGERIES      I have reviewed the social history and family history with the patient and they are unchanged from previous note.  ALLERGIES:  has No Known Allergies.  MEDICATIONS:  Current Outpatient Medications  Medication Sig Dispense Refill  . bictegravir-emtricitabine-tenofovir AF (BIKTARVY) 50-200-25 MG TABS tablet Take 1 tablet by mouth daily. 30 tablet 8  . ferrous sulfate 325 (65 FE) MG tablet Take 1 tablet (325 mg total) by mouth daily with breakfast. 30 tablet 6  . levothyroxine (SYNTHROID, LEVOTHROID) 50 MCG tablet  Take 1 tablet (50 mcg total) by mouth daily before breakfast. 30 tablet 6  . sulfamethoxazole-trimethoprim (BACTRIM DS,SEPTRA DS) 800-160 MG tablet Take 1 tablet by mouth daily. 30 tablet 6   No current facility-administered medications for this visit.     PHYSICAL EXAMINATION: ECOG PERFORMANCE STATUS: {CHL ONC ECOG PS:[id number]}  There were no vitals filed for this visit. There were no vitals filed for this visit.  GENERAL:alert, no distress and comfortable SKIN: skin color, texture, turgor are normal, no rashes or significant lesions EYES: normal, Conjunctiva are pink and non-injected, sclera clear OROPHARYNX:no exudate, no erythema and lips, buccal mucosa, and tongue normal  NECK: supple, thyroid normal size, non-tender, without nodularity LYMPH:  no palpable lymphadenopathy in the cervical, axillary or inguinal LUNGS: clear to auscultation and percussion with normal breathing effort HEART: regular rate & rhythm and no murmurs and no lower extremity edema ABDOMEN:abdomen soft, non-tender and normal bowel sounds Musculoskeletal:no cyanosis of digits and no clubbing  NEURO: alert & oriented x 3 with fluent speech, no focal motor/sensory deficits  LABORATORY DATA:  I have reviewed the data as listed CBC Latest Ref Rng & Units 10/15/2017 09/25/2017 09/11/2017  WBC 3.8 - 10.8 Thousand/uL 5.2 4.7 4.6  Hemoglobin 13.2 - 17.1 g/dL 13.2 12.2(L) 11.7(L)  Hematocrit 38.5 - 50.0 % 39.0 37.5(L) 34.7(L)  Platelets 140 - 400 Thousand/uL 65(L) 67(L) 46.0 Repeated and verified X2. No platelet clumps seen on smear.(LL)     CMP Latest Ref Rng & Units 10/15/2017 09/25/2017 09/11/2017  Glucose 65 - 99 mg/dL 95 98 82  BUN 7 - 25 mg/dL 17 19 20   Creatinine 0.70 - 1.33  mg/dL 2.14(H) 1.75(H) 1.82(H)  Sodium 135 - 146 mmol/L 135 139 137  Potassium 3.5 - 5.3 mmol/L 4.7 4.0 4.2  Chloride 98 - 110 mmol/L 103 107 102  CO2 20 - 32 mmol/L 27 28 29   Calcium 8.6 - 10.3 mg/dL 9.4 9.4 9.2  Total Protein 6.1 - 8.1  g/dL 8.6(H) 8.7(H) 8.1  Total Bilirubin 0.2 - 1.2 mg/dL 0.3 0.3 0.3  Alkaline Phos 38 - 126 U/L - 62 46  AST 10 - 35 U/L 47(H) 21 17  ALT 9 - 46 U/L 63(H) 24 18      RADIOGRAPHIC STUDIES: I have personally reviewed the radiological images as listed and agreed with the findings in the report. No results found.   [place] ABD IMPRESSION: 09/21/17  The left kidney is pelvic in location. The left renal collecting system may be duplicated. The right kidney is within normal limits. There is no hydronephrosis.  Liver and spleen are within normal limits.  ASSESSMENT & PLAN: Hunter Graves is a pleasant 52 year old AAM with no significant PMH found to have HIV, CKD, and pancytopenia on work up for syncopal episode   1. Pancytopenia  2. 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 Baxter Flattery  3. CKD - followed by PCP Dr. Birdie Riddle   PLAN No problem-specific Assessment & Plan notes found for this encounter.   No orders of the defined types were placed in this encounter.  All questions were answered. The patient knows to call the clinic with any problems, questions or concerns. No barriers to learning was detected. I spent {CHL ONC TIME VISIT - HGDJM:[id number]} counseling the patient face to face. The total time spent in the appointment was {CHL ONC TIME VISIT - IWLNL:[id number]} and more than 50% was on counseling and review of test results     Alla Feeling, NP 11/13/17

## 2017-11-14 ENCOUNTER — Encounter: Payer: Self-pay | Admitting: Internal Medicine

## 2017-11-14 ENCOUNTER — Telehealth: Payer: Self-pay | Admitting: Nurse Practitioner

## 2017-11-14 ENCOUNTER — Ambulatory Visit (INDEPENDENT_AMBULATORY_CARE_PROVIDER_SITE_OTHER): Payer: BC Managed Care – PPO | Admitting: Internal Medicine

## 2017-11-14 VITALS — BP 147/83 | HR 96 | Temp 98.4°F | Wt 142.8 lb

## 2017-11-14 DIAGNOSIS — Z23 Encounter for immunization: Secondary | ICD-10-CM | POA: Diagnosis not present

## 2017-11-14 DIAGNOSIS — B20 Human immunodeficiency virus [HIV] disease: Secondary | ICD-10-CM

## 2017-11-14 DIAGNOSIS — N183 Chronic kidney disease, stage 3 unspecified: Secondary | ICD-10-CM

## 2017-11-14 NOTE — Progress Notes (Signed)
RFV: follow up for hiv disease  Patient ID: Hunter Graves, male   DOB: 11-Mar-1966, 52 y.o.   MRN: [id number]  HPI Hunter Graves is a 52yo M with advanced hiv disease, recently diagnosed and started on biktarvy. Cd 4 count of 180/VL 88 in late July. He also has hx of ckd 3- currently also on oi proph with bactrim.  He reports doing well. No recent illnesses. Has had some weight gain since starting his medications.  Outpatient Encounter Medications as of 11/14/2017  Medication Sig  . bictegravir-emtricitabine-tenofovir AF (BIKTARVY) 50-200-25 MG TABS tablet Take 1 tablet by mouth daily.  . ferrous sulfate 325 (65 FE) MG tablet Take 1 tablet (325 mg total) by mouth daily with breakfast.  . levothyroxine (SYNTHROID, LEVOTHROID) 50 MCG tablet Take 1 tablet (50 mcg total) by mouth daily before breakfast.  . sulfamethoxazole-trimethoprim (BACTRIM DS,SEPTRA DS) 800-160 MG tablet Take 1 tablet by mouth daily.   No facility-administered encounter medications on file as of 11/14/2017.      Patient Active Problem List   Diagnosis Date Noted  . Hypotension 09/11/2017  . Protein-calorie malnutrition ([place]) 09/02/2017  . Vasovagal syncope 08/29/2017  . HIV disease ([place])   . Other specified hypothyroidism   . Thrombocytopenia ([place])   . Microcytic anemia   . Elevated serum creatinine   . Hypomagnesemia      Health Maintenance Due  Topic Date Due  . INFLUENZA VACCINE  10/25/2017     Review of Systems 12 point ros is otherwise negative Physical Exam   BP (!) 147/83   Pulse 96   Temp 98.4 F (36.9 C)   Wt 142 lb 12.8 oz (64.8 kg)   BMI 20.49 kg/m   Physical Exam  Constitutional: He is oriented to person, place, and time. He appears well-developed and well-nourished. No distress.  HENT:  Mouth/Throat: Oropharynx is clear and moist. No oropharyngeal exudate.  Cardiovascular: Normal rate, regular rhythm and normal heart sounds. Exam reveals no gallop and no friction rub.  No murmur  heard.  Pulmonary/Chest: Effort normal and breath sounds normal. No respiratory distress. He has no wheezes.  Abdominal: Soft. Bowel sounds are normal. He exhibits no distension. There is no tenderness.  Lymphadenopathy:  He has no cervical adenopathy.  Neurological: He is alert and oriented to person, place, and time.  Skin: Skin is warm and dry. No rash noted. No erythema.  Psychiatric: He has a normal mood and affect. His behavior is normal.    Lab Results  Component Value Date   CD4TCELL 11 (L) 10/15/2017   Lab Results  Component Value Date   CD4TABS 180 (L) 10/15/2017   CD4TABS 70 (L) 08/24/2017   Lab Results  Component Value Date   HIV1RNAQUANT 88 (H) 10/15/2017   Lab Results  Component Value Date   HEPBSAB Non Reactive 08/24/2017   Lab Results  Component Value Date   LABRPR NON-REACTIVE 08/29/2017    CBC Lab Results  Component Value Date   WBC 5.2 10/15/2017   RBC 4.69 10/15/2017   HGB 13.2 10/15/2017   HCT 39.0 10/15/2017   PLT 65 (L) 10/15/2017   MCV 83.2 10/15/2017   MCH 28.1 10/15/2017   MCHC 33.8 10/15/2017   RDW 18.5 (H) 10/15/2017   LYMPHSABS 1,617 10/15/2017   MONOABS 0.5 09/25/2017   EOSABS 473 10/15/2017    BMET Lab Results  Component Value Date   NA 135 10/15/2017   K 4.7 10/15/2017   CL 103 10/15/2017  CO2 27 10/15/2017   GLUCOSE 95 10/15/2017   BUN 17 10/15/2017   CREATININE 2.14 (H) 10/15/2017   CALCIUM 9.4 10/15/2017   GFRNONAA 35 (L) 10/15/2017   GFRAA 40 (L) 10/15/2017      Assessment and Plan  oi proph = will stop bactrim. Will check g6pd- plan to switch to dapsone 100mg  daily  aki = will check ua and stop bactrim and will repeat cr I n2 wk  hiv disease = continue with biktarvy. Will check labs to see if he is undetectable  Health maintenance = will give pneumovax  rtc for labs

## 2017-11-14 NOTE — Telephone Encounter (Signed)
Called pt to schedule appts per 8/21 sch message. Pt will call when he can schedule an appt due to his work schedule.

## 2017-11-14 NOTE — Patient Instructions (Signed)
  Please come to lab in 2 wk to check kidney function   rtc in 4-6 wk

## 2017-11-15 ENCOUNTER — Telehealth: Payer: Self-pay | Admitting: *Deleted

## 2017-11-15 LAB — URINALYSIS
Bilirubin Urine: NEGATIVE
Glucose, UA: NEGATIVE
Ketones, ur: NEGATIVE
LEUKOCYTES UA: NEGATIVE
Nitrite: NEGATIVE
PH: 5.5 (ref 5.0–8.0)
SPECIFIC GRAVITY, URINE: 1.018 (ref 1.001–1.03)

## 2017-11-15 LAB — MICROALBUMIN / CREATININE URINE RATIO
Creatinine, Urine: 153 mg/dL (ref 20–320)
Microalb Creat Ratio: 78 mcg/mg creat — ABNORMAL HIGH (ref <30)
Microalb, Ur: 12 mg/dL

## 2017-11-15 LAB — T-HELPER CELL (CD4) - (RCID CLINIC ONLY)
CD4 T CELL ABS: 170 /uL — AB (ref 400–2700)
CD4 T CELL HELPER: 12 % — AB (ref 33–55)

## 2017-11-15 NOTE — Telephone Encounter (Signed)
Per Dr Baxter Flattery called the patient pharmacy to have his Bactrim D/C due to recent kidney injury. Patient is aware to stop taking the medication also.

## 2017-11-16 LAB — BASIC METABOLIC PANEL
BUN / CREAT RATIO: 7 (calc) (ref 6–22)
BUN: 13 mg/dL (ref 7–25)
CO2: 26 mmol/L (ref 20–32)
Calcium: 9 mg/dL (ref 8.6–10.3)
Chloride: 105 mmol/L (ref 98–110)
Creat: 1.82 mg/dL — ABNORMAL HIGH (ref 0.70–1.33)
Glucose, Bld: 78 mg/dL (ref 65–99)
Potassium: 3.9 mmol/L (ref 3.5–5.3)
SODIUM: 139 mmol/L (ref 135–146)

## 2017-11-16 LAB — HIV-1 RNA QUANT-NO REFLEX-BLD
HIV 1 RNA QUANT: 49 {copies}/mL — AB
HIV-1 RNA Quant, Log: 1.69 Log copies/mL — ABNORMAL HIGH

## 2017-11-16 LAB — GLUCOSE 6 PHOSPHATE DEHYDROGENASE: G-6PDH: 17.8 U/g{Hb} (ref 7.0–20.5)

## 2017-11-16 MED ORDER — DAPSONE 100 MG PO TABS: 100.0000 mg | ORAL_TABLET | Freq: Every day | ORAL | 5 refills | Status: DC

## 2017-11-28 ENCOUNTER — Other Ambulatory Visit: Payer: BC Managed Care – PPO

## 2017-11-30 ENCOUNTER — Other Ambulatory Visit: Payer: Self-pay | Admitting: *Deleted

## 2017-11-30 DIAGNOSIS — N179 Acute kidney failure, unspecified: Secondary | ICD-10-CM

## 2017-12-03 ENCOUNTER — Other Ambulatory Visit: Payer: Self-pay | Admitting: *Deleted

## 2017-12-03 ENCOUNTER — Other Ambulatory Visit: Payer: BC Managed Care – PPO

## 2017-12-03 DIAGNOSIS — R7989 Other specified abnormal findings of blood chemistry: Secondary | ICD-10-CM

## 2017-12-03 DIAGNOSIS — N179 Acute kidney failure, unspecified: Secondary | ICD-10-CM

## 2017-12-03 LAB — COMPLETE METABOLIC PANEL WITH GFR
AG Ratio: 0.9 (calc) — ABNORMAL LOW (ref 1.0–2.5)
ALT: 13 U/L (ref 9–46)
AST: 20 U/L (ref 10–35)
Albumin: 3.8 g/dL (ref 3.6–5.1)
Alkaline phosphatase (APISO): 61 U/L (ref 40–115)
BILIRUBIN TOTAL: 0.7 mg/dL (ref 0.2–1.2)
BUN/Creatinine Ratio: 9 (calc) (ref 6–22)
BUN: 17 mg/dL (ref 7–25)
CHLORIDE: 104 mmol/L (ref 98–110)
CO2: 27 mmol/L (ref 20–32)
Calcium: 9.1 mg/dL (ref 8.6–10.3)
Creat: 1.9 mg/dL — ABNORMAL HIGH (ref 0.70–1.33)
GFR, EST AFRICAN AMERICAN: 46 mL/min/{1.73_m2} — AB (ref >=60)
GFR, Est Non African American: 40 mL/min/{1.73_m2} — ABNORMAL LOW (ref >=60)
GLUCOSE: 106 mg/dL — AB (ref 65–99)
Globulin: 4.3 g/dL (calc) — ABNORMAL HIGH (ref 1.9–3.7)
Potassium: 4 mmol/L (ref 3.5–5.3)
Sodium: 138 mmol/L (ref 135–146)
TOTAL PROTEIN: 8.1 g/dL (ref 6.1–8.1)

## 2017-12-04 LAB — URINALYSIS, ROUTINE W REFLEX MICROSCOPIC
Bilirubin Urine: NEGATIVE
GLUCOSE, UA: NEGATIVE
Hgb urine dipstick: NEGATIVE
Ketones, ur: NEGATIVE
LEUKOCYTES UA: NEGATIVE
Nitrite: NEGATIVE
PROTEIN: NEGATIVE
Specific Gravity, Urine: 1.02 (ref 1.001–1.03)
pH: <=5 (ref 5.0–8.0)

## 2017-12-04 LAB — MICROALBUMIN / CREATININE URINE RATIO
Creatinine, Urine: 174 mg/dL (ref 20–320)
Microalb Creat Ratio: 14 mcg/mg creat (ref <30)
Microalb, Ur: 2.5 mg/dL

## 2017-12-12 ENCOUNTER — Ambulatory Visit (INDEPENDENT_AMBULATORY_CARE_PROVIDER_SITE_OTHER): Payer: BC Managed Care – PPO | Admitting: Family Medicine

## 2017-12-12 ENCOUNTER — Encounter: Payer: Self-pay | Admitting: Family Medicine

## 2017-12-12 ENCOUNTER — Other Ambulatory Visit: Payer: Self-pay

## 2017-12-12 VITALS — BP 121/78 | HR 76 | Temp 98.2°F | Resp 16 | Ht 70.0 in | Wt 142.5 lb

## 2017-12-12 DIAGNOSIS — R7989 Other specified abnormal findings of blood chemistry: Secondary | ICD-10-CM

## 2017-12-12 DIAGNOSIS — I959 Hypotension, unspecified: Secondary | ICD-10-CM | POA: Diagnosis not present

## 2017-12-12 DIAGNOSIS — E038 Other specified hypothyroidism: Secondary | ICD-10-CM | POA: Diagnosis not present

## 2017-12-12 DIAGNOSIS — E440 Moderate protein-calorie malnutrition: Secondary | ICD-10-CM

## 2017-12-12 LAB — BASIC METABOLIC PANEL WITH GFR
BUN: 19 mg/dL (ref 6–23)
CO2: 29 meq/L (ref 19–32)
Calcium: 9.2 mg/dL (ref 8.4–10.5)
Chloride: 105 meq/L (ref 96–112)
Creatinine, Ser: 1.64 mg/dL — ABNORMAL HIGH (ref 0.40–1.50)
GFR: 57.23 mL/min — ABNORMAL LOW
Glucose, Bld: 89 mg/dL (ref 70–99)
Potassium: 4.2 meq/L (ref 3.5–5.1)
Sodium: 138 meq/L (ref 135–145)

## 2017-12-12 LAB — TSH: TSH: 0.19 u[IU]/mL — AB (ref 0.35–4.50)

## 2017-12-12 NOTE — Patient Instructions (Signed)
Schedule your complete physical in 3-4 months We'll notify you of your lab results and make any changes if needed Keep up the good work!  You look great! KEEP EATING!!!  It's working! Call with any questions or concerns Happy Fall!!!  (We miss you!!)

## 2017-12-12 NOTE — Progress Notes (Signed)
   Subjective:    Patient ID: Hunter Graves, male    DOB: [birth year], 52 y.o.   MRN: [id number]  HPI Hypothyroid- pt is currently on 32mcg daily.  Due for repeat TSH level.  Energy level is back to normal.  No palpitations, no SOB, CP.  Protein-Calorie Malnutrition- pt has gained 14 lbs since last visit here.  'i'm still eating like a horse'.    Hypotension- improved.  BP is now normal at 121/78.  No longer having pre-syncopal episodes.  No blurry/double vision.   Review of Systems For ROS see HPI     Objective:   Physical Exam  Constitutional: He is oriented to person, place, and time. He appears well-developed and well-nourished. No distress.  HENT:  Head: Normocephalic and atraumatic.  Eyes: Pupils are equal, round, and reactive to light. Conjunctivae and EOM are normal.  Neck: Normal range of motion. Neck supple. No thyromegaly present.  Cardiovascular: Normal rate, regular rhythm, normal heart sounds and intact distal pulses.  No murmur heard. Pulmonary/Chest: Effort normal and breath sounds normal. No respiratory distress.  Abdominal: Soft. Bowel sounds are normal. He exhibits no distension.  Musculoskeletal: He exhibits no edema.  Lymphadenopathy:    He has no cervical adenopathy.  Neurological: He is alert and oriented to person, place, and time. No cranial nerve deficit.  Skin: Skin is warm and dry.  Psychiatric: He has a normal mood and affect. His behavior is normal.  Vitals reviewed.         Assessment & Plan:

## 2017-12-12 NOTE — Assessment & Plan Note (Signed)
Pt's Bactrim was D/C'd by ID.  Repeat labs today and monitor for improvement.

## 2017-12-12 NOTE — Assessment & Plan Note (Signed)
Much improved.  Pt has gained 14 lbs since last visit.  Encouraged him to keep eating.  Will continue to follow.

## 2017-12-12 NOTE — Assessment & Plan Note (Signed)
Pt is currently asymptomatic.  Tolerating Levothyroxine w/o difficulty.  Check labs.  Adjust meds prn

## 2017-12-12 NOTE — Assessment & Plan Note (Signed)
BP is MUCH improved.  No longer hypotensive or having pre-syncopal episodes.  No work up needed at this time.  Will continue to follow.

## 2017-12-13 ENCOUNTER — Other Ambulatory Visit: Payer: Self-pay | Admitting: Family Medicine

## 2017-12-13 DIAGNOSIS — E038 Other specified hypothyroidism: Secondary | ICD-10-CM

## 2017-12-17 ENCOUNTER — Ambulatory Visit (INDEPENDENT_AMBULATORY_CARE_PROVIDER_SITE_OTHER): Payer: BC Managed Care – PPO | Admitting: Internal Medicine

## 2017-12-17 ENCOUNTER — Encounter: Payer: Self-pay | Admitting: Internal Medicine

## 2017-12-17 VITALS — BP 142/72 | HR 99 | Temp 98.8°F | Wt 146.0 lb

## 2017-12-17 DIAGNOSIS — Z23 Encounter for immunization: Secondary | ICD-10-CM | POA: Diagnosis not present

## 2017-12-17 DIAGNOSIS — N183 Chronic kidney disease, stage 3 unspecified: Secondary | ICD-10-CM

## 2017-12-17 DIAGNOSIS — B20 Human immunodeficiency virus [HIV] disease: Secondary | ICD-10-CM

## 2017-12-17 NOTE — Progress Notes (Signed)
RFV: follow up for hiv disease  Patient ID: Hunter Graves, male   DOB: Jun 20, 1965, 52 y.o.   MRN: [id number]  HPI 52yo M with hiv disease, on biktarvy. Doing well with adherence. Continues on  Dapsone for oi proph. No recent sexual relationships. Has been in good health otherwise.  Outpatient Encounter Medications as of 12/17/2017  Medication Sig  . bictegravir-emtricitabine-tenofovir AF (BIKTARVY) 50-200-25 MG TABS tablet Take 1 tablet by mouth daily.  . dapsone 100 MG tablet Take 1 tablet (100 mg total) by mouth daily.  . ferrous sulfate 325 (65 FE) MG tablet Take 1 tablet (325 mg total) by mouth daily with breakfast.  . levothyroxine (SYNTHROID, LEVOTHROID) 50 MCG tablet Take 1 tablet (50 mcg total) by mouth daily before breakfast.   No facility-administered encounter medications on file as of 12/17/2017.      Patient Active Problem List   Diagnosis Date Noted  . Hypotension 09/11/2017  . Protein-calorie malnutrition ([place]) 09/02/2017  . Vasovagal syncope 08/29/2017  . HIV disease ([place])   . Other specified hypothyroidism   . Thrombocytopenia ([place])   . Microcytic anemia   . Elevated serum creatinine   . Hypomagnesemia    Social History   Tobacco Use  . Smoking status: Never Smoker  . Smokeless tobacco: Never Used  Substance Use Topics  . Alcohol use: Not Currently    Comment: 08/21/2017 "1 drink q 6 months or less"  . Drug use: Not Currently    There are no preventive care reminders to display for this patient.   Review of Systems Review of Systems  Constitutional: Negative for fever, chills, diaphoresis, activity change, appetite change, fatigue and unexpected weight change.  HENT: Negative for congestion, sore throat, rhinorrhea, sneezing, trouble swallowing and sinus pressure.  Eyes: Negative for photophobia and visual disturbance.  Respiratory: Negative for cough, chest tightness, shortness of breath, wheezing and stridor.  Cardiovascular: Negative for chest  pain, palpitations and leg swelling.  Gastrointestinal: Negative for nausea, vomiting, abdominal pain, diarrhea, constipation, blood in stool, abdominal distention and anal bleeding.  Genitourinary: Negative for dysuria, hematuria, flank pain and difficulty urinating.  Musculoskeletal: Negative for myalgias, back pain, joint swelling, arthralgias and gait problem.  Skin: Negative for color change, pallor, rash and wound.  Neurological: Negative for dizziness, tremors, weakness and light-headedness.  Hematological: Negative for adenopathy. Does not bruise/bleed easily.  Psychiatric/Behavioral: Negative for behavioral problems, confusion, sleep disturbance, dysphoric mood, decreased concentration and agitation.    Physical Exam   BP (!) 142/72   Pulse 99   Temp 98.8 F (37.1 C) (Oral)   Wt 146 lb (66.2 kg)   BMI 20.95 kg/m   Physical Exam  Constitutional: He is oriented to person, place, and time. He appears well-developed and well-nourished. No distress.  HENT:  Mouth/Throat: Oropharynx is clear and moist. No oropharyngeal exudate.  Cardiovascular: Normal rate, regular rhythm and normal heart sounds. Exam reveals no gallop and no friction rub.  No murmur heard.  Pulmonary/Chest: Effort normal and breath sounds normal. No respiratory distress. He has no wheezes.  Abdominal: Soft. Bowel sounds are normal. He exhibits no distension. There is no tenderness.  Lymphadenopathy:  He has no cervical adenopathy.  Neurological: He is alert and oriented to person, place, and time.  Skin: Skin is warm and dry. No rash noted. No erythema.  Psychiatric: He has a normal mood and affect. His behavior is normal.    Lab Results  Component Value Date   CD4TCELL 12 (L) 11/14/2017  Lab Results  Component Value Date   CD4TABS 170 (L) 11/14/2017   CD4TABS 180 (L) 10/15/2017   CD4TABS 70 (L) 08/24/2017   Lab Results  Component Value Date   HIV1RNAQUANT 49 (H) 11/14/2017   Lab Results    Component Value Date   HEPBSAB Non Reactive 08/24/2017   Lab Results  Component Value Date   LABRPR NON-REACTIVE 08/29/2017    CBC Lab Results  Component Value Date   WBC 5.2 10/15/2017   RBC 4.69 10/15/2017   HGB 13.2 10/15/2017   HCT 39.0 10/15/2017   PLT 65 (L) 10/15/2017   MCV 83.2 10/15/2017   MCH 28.1 10/15/2017   MCHC 33.8 10/15/2017   RDW 18.5 (H) 10/15/2017   LYMPHSABS 1,617 10/15/2017   MONOABS 0.5 09/25/2017   EOSABS 473 10/15/2017    BMET Lab Results  Component Value Date   NA 138 12/12/2017   K 4.2 12/12/2017   CL 105 12/12/2017   CO2 29 12/12/2017   GLUCOSE 89 12/12/2017   BUN 19 12/12/2017   CREATININE 1.64 (H) 12/12/2017   CALCIUM 9.2 12/12/2017   GFRNONAA 40 (L) 12/03/2017   GFRAA 46 (L) 12/03/2017      Assessment and Plan  Health maintenance Flu vaccine today  hiv disease = Continue on dapsone and biktarvy  ckd 3 - will check cr function to see if any improvements. May need to refer to nephrology  Will see in November to do blood work and see how much further to do his dapsone

## 2017-12-31 ENCOUNTER — Telehealth: Payer: Self-pay | Admitting: Family Medicine

## 2017-12-31 NOTE — Telephone Encounter (Signed)
Copied from [place] [id number]. Topic: Quick Communication - See Telephone Encounter >> Dec 31, 2017 10:04 AM Berneta Levins wrote: CRM for notification. See Telephone encounter for: 12/31/17.  Pt states that he has a jury summons for Tuesday 02/12/18 and the wants to know if the doctor will write a note excusing him because of his current medical issues. Pt can be reached at [phone].

## 2017-12-31 NOTE — Telephone Encounter (Signed)
Called pt and phone was not working.   [place] for [facility] to Discuss results / PCP recommendations / Schedule patient.

## 2017-12-31 NOTE — Telephone Encounter (Signed)
Unfortunately/Fortunately, there is not a medical reason at this time to excuse from [employer].  He is going quite well which is great news!

## 2017-12-31 NOTE — Telephone Encounter (Signed)
Tried calling pt again and could not get through on phone.

## 2017-12-31 NOTE — Telephone Encounter (Signed)
Please advise 

## 2018-01-01 NOTE — Telephone Encounter (Signed)
Patient notified of PCP recommendations and is agreement and expresses an understanding.   Ok for [facility] to Discuss results / PCP recommendations / Schedule patient.   

## 2018-01-02 ENCOUNTER — Telehealth: Payer: Self-pay | Admitting: *Deleted

## 2018-01-02 NOTE — Telephone Encounter (Signed)
Patient called to advise he does not want to do [employer] and would like for Dr Baxter Flattery to write him a letter stating he can not do [employer]. Advised him HIV does not keep you from being able to sit for [employer] and we usually do not write people out unless they have a disability of some kind but will ask the doctor anyway and we will call him back.

## 2018-01-04 NOTE — Telephone Encounter (Signed)
Probably have his pcp do a letter.

## 2018-01-14 NOTE — Telephone Encounter (Signed)
Called the patient to advise to contact his PCP and had to leave a message for him to call the office back.

## 2018-01-16 ENCOUNTER — Other Ambulatory Visit: Payer: BC Managed Care – PPO

## 2018-01-21 ENCOUNTER — Encounter: Payer: Self-pay | Admitting: General Practice

## 2018-01-21 ENCOUNTER — Other Ambulatory Visit (INDEPENDENT_AMBULATORY_CARE_PROVIDER_SITE_OTHER): Payer: BC Managed Care – PPO

## 2018-01-21 DIAGNOSIS — E038 Other specified hypothyroidism: Secondary | ICD-10-CM | POA: Diagnosis not present

## 2018-01-21 LAB — TSH: TSH: 0.45 u[IU]/mL (ref 0.35–4.50)

## 2018-02-25 ENCOUNTER — Ambulatory Visit: Payer: BC Managed Care – PPO | Admitting: Internal Medicine

## 2018-03-06 ENCOUNTER — Ambulatory Visit (INDEPENDENT_AMBULATORY_CARE_PROVIDER_SITE_OTHER): Payer: BC Managed Care – PPO | Admitting: Internal Medicine

## 2018-03-06 ENCOUNTER — Encounter: Payer: Self-pay | Admitting: Internal Medicine

## 2018-03-06 VITALS — BP 132/79 | HR 84 | Temp 97.6°F | Wt 148.0 lb

## 2018-03-06 DIAGNOSIS — N183 Chronic kidney disease, stage 3 unspecified: Secondary | ICD-10-CM

## 2018-03-06 DIAGNOSIS — B20 Human immunodeficiency virus [HIV] disease: Secondary | ICD-10-CM

## 2018-03-06 DIAGNOSIS — D508 Other iron deficiency anemias: Secondary | ICD-10-CM

## 2018-03-06 NOTE — Progress Notes (Signed)
Patient ID: Hunter Graves, male   DOB: 07-Sep-1965, 52 y.o.   MRN: [id number]  HPI 52yo M with htn, hypothyroidism, hiv disease, CD 4 count of 170/VL<20 dx 2019 on biktarvy with great adherence. Has been in good health since we last saw him. Remains not in any relationships. 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 bictegravir-emtricitabine-tenofovir AF (BIKTARVY) 50-200-25 MG TABS tablet Take 1 tablet by mouth daily.  . dapsone 100 MG tablet Take 1 tablet (100 mg total) by mouth daily.  . ferrous sulfate 325 (65 FE) MG tablet Take 1 tablet (325 mg total) by mouth daily with breakfast.  . levothyroxine (SYNTHROID, LEVOTHROID) 50 MCG tablet Take 1 tablet (50 mcg total) by mouth daily before breakfast.   No facility-administered encounter medications on file as of 03/06/2018.      Patient Active Problem List   Diagnosis Date Noted  . Hypotension 09/11/2017  . Protein-calorie malnutrition ([place]) 09/02/2017  . Vasovagal syncope 08/29/2017  . HIV disease ([place])   . Other specified hypothyroidism   . Thrombocytopenia ([place])   . Microcytic anemia   . Elevated serum creatinine   . Hypomagnesemia    Social History   Tobacco Use  . Smoking status: Never Smoker  . Smokeless tobacco: Never Used  Substance Use Topics  . Alcohol use: Not Currently    Comment: 08/21/2017 "1 drink q 6 months or less"  . Drug use: Not Currently    There are no preventive care reminders to display for this patient.   Review of Systems Review of Systems  Constitutional: Negative for fever, chills, diaphoresis, activity change, appetite change, fatigue and unexpected weight change.  HENT: Negative for congestion, sore throat, rhinorrhea, sneezing, trouble swallowing and sinus pressure.  Eyes: Negative for photophobia and visual disturbance.  Respiratory: Negative for cough, chest tightness, shortness of breath, wheezing and  stridor.  Cardiovascular: Negative for chest pain, palpitations and leg swelling.  Gastrointestinal: Negative for nausea, vomiting, abdominal pain, diarrhea, constipation, blood in stool, abdominal distention and anal bleeding.  Genitourinary: Negative for dysuria, hematuria, flank pain and difficulty urinating.  Musculoskeletal: Negative for myalgias, back pain, joint swelling, arthralgias and gait problem.  Skin: Negative for color change, pallor, rash and wound.  Neurological: Negative for dizziness, tremors, weakness and light-headedness.  Hematological: Negative for adenopathy. Does not bruise/bleed easily.  Psychiatric/Behavioral: Negative for behavioral problems, confusion, sleep disturbance, dysphoric mood, decreased concentration and agitation.    Physical Exam   BP 132/79   Pulse 84   Temp 97.6 F (36.4 C) (Oral)   Wt 148 lb (67.1 kg)   BMI 21.24 kg/m   Physical Exam  Constitutional: He is oriented to person, place, and time. He appears well-developed and well-nourished. No distress.  HENT:  Mouth/Throat: Oropharynx is clear and moist. No oropharyngeal exudate.  Cardiovascular: Normal rate, regular rhythm and normal heart sounds. Exam reveals no gallop and no friction rub.  No murmur heard.  Pulmonary/Chest: Effort normal and breath sounds normal. No respiratory distress. He has no wheezes.  Abdominal: Soft. Bowel sounds are normal. He exhibits no distension. There is no tenderness.  Lymphadenopathy:  He has no cervical adenopathy.  Neurological: He is alert and oriented to person, place, and time.  Skin: Skin is warm and dry. No rash noted. No erythema.  Psychiatric: He has a normal mood and affect. His behavior is normal.    Lab Results  Component Value Date   CD4TCELL 12 (L) 11/14/2017   Lab Results  Component Value Date   CD4TABS 170 (L) 11/14/2017   CD4TABS 180 (L) 10/15/2017   CD4TABS 70 (L) 08/24/2017   Lab Results  Component Value Date   HIV1RNAQUANT  49 (H) 11/14/2017   Lab Results  Component Value Date   HEPBSAB Non Reactive 08/24/2017   Lab Results  Component Value Date   LABRPR NON-REACTIVE 08/29/2017    CBC Lab Results  Component Value Date   WBC 5.2 10/15/2017   RBC 4.69 10/15/2017   HGB 13.2 10/15/2017   HCT 39.0 10/15/2017   PLT 65 (L) 10/15/2017   MCV 83.2 10/15/2017   MCH 28.1 10/15/2017   MCHC 33.8 10/15/2017   RDW 18.5 (H) 10/15/2017   LYMPHSABS 1,617 10/15/2017   MONOABS 0.5 09/25/2017   EOSABS 473 10/15/2017    BMET Lab Results  Component Value Date   NA 138 12/12/2017   K 4.2 12/12/2017   CL 105 12/12/2017   CO2 29 12/12/2017   GLUCOSE 89 12/12/2017   BUN 19 12/12/2017   CREATININE 1.64 (H) 12/12/2017   CALCIUM 9.2 12/12/2017   GFRNONAA 40 (L) 12/03/2017   GFRAA 46 (L) 12/03/2017      Assessment and Plan   hiv disease= biktarvy, and dapsone in evening  Long term medication management = will check cr  Iron deficiency = on iron with meals in the mornig  ckd 3 = stable

## 2018-03-07 LAB — T-HELPER CELL (CD4) - (RCID CLINIC ONLY)
CD4 T CELL HELPER: 30 % — AB (ref 33–55)
CD4 T Cell Abs: 220 /uL — ABNORMAL LOW (ref 400–2700)

## 2018-03-08 LAB — CBC WITH DIFFERENTIAL/PLATELET
BASOS PCT: 0.5 %
Basophils Absolute: 30 cells/uL (ref 0–200)
EOS ABS: 372 {cells}/uL (ref 15–500)
Eosinophils Relative: 6.2 %
HEMATOCRIT: 32.7 % — AB (ref 38.5–50.0)
Hemoglobin: 10.9 g/dL — ABNORMAL LOW (ref 13.2–17.1)
LYMPHS ABS: 1722 {cells}/uL (ref 850–3900)
MCH: 31.7 pg (ref 27.0–33.0)
MCHC: 33.3 g/dL (ref 32.0–36.0)
MCV: 95.1 fL (ref 80.0–100.0)
MONOS PCT: 6.9 %
MPV: 11.8 fL (ref 7.5–12.5)
Neutro Abs: 3462 cells/uL (ref 1500–7800)
Neutrophils Relative %: 57.7 %
Platelets: 142 10*3/uL (ref 140–400)
RBC: 3.44 10*6/uL — AB (ref 4.20–5.80)
RDW: 12.7 % (ref 11.0–15.0)
Total Lymphocyte: 28.7 %
WBC mixed population: 414 cells/uL (ref 200–950)
WBC: 6 10*3/uL (ref 3.8–10.8)

## 2018-03-08 LAB — HIV-1 RNA QUANT-NO REFLEX-BLD
HIV 1 RNA QUANT: 33 {copies}/mL — AB
HIV-1 RNA QUANT, LOG: 1.52 {Log_copies}/mL — AB

## 2018-03-11 ENCOUNTER — Other Ambulatory Visit: Payer: Self-pay | Admitting: *Deleted

## 2018-03-11 DIAGNOSIS — B20 Human immunodeficiency virus [HIV] disease: Secondary | ICD-10-CM

## 2018-03-11 MED ORDER — BICTEGRAVIR-EMTRICITAB-TENOFOV 50-200-25 MG PO TABS: 1.0000 | ORAL_TABLET | Freq: Every day | ORAL | 5 refills | Status: DC

## 2018-04-09 ENCOUNTER — Other Ambulatory Visit: Payer: Self-pay | Admitting: Internal Medicine

## 2018-04-10 ENCOUNTER — Other Ambulatory Visit: Payer: Self-pay | Admitting: Family Medicine

## 2018-05-23 ENCOUNTER — Other Ambulatory Visit: Payer: Self-pay

## 2018-05-23 ENCOUNTER — Ambulatory Visit (INDEPENDENT_AMBULATORY_CARE_PROVIDER_SITE_OTHER): Payer: BC Managed Care – PPO | Admitting: Family Medicine

## 2018-05-23 ENCOUNTER — Encounter: Payer: Self-pay | Admitting: Family Medicine

## 2018-05-23 VITALS — BP 122/81 | HR 78 | Temp 98.0°F | Resp 16 | Ht 70.0 in | Wt 150.4 lb

## 2018-05-23 DIAGNOSIS — Z0000 Encounter for general adult medical examination without abnormal findings: Secondary | ICD-10-CM | POA: Insufficient documentation

## 2018-05-23 DIAGNOSIS — Z125 Encounter for screening for malignant neoplasm of prostate: Secondary | ICD-10-CM

## 2018-05-23 DIAGNOSIS — Z1211 Encounter for screening for malignant neoplasm of colon: Secondary | ICD-10-CM

## 2018-05-23 DIAGNOSIS — Z23 Encounter for immunization: Secondary | ICD-10-CM | POA: Diagnosis not present

## 2018-05-23 DIAGNOSIS — B350 Tinea barbae and tinea capitis: Secondary | ICD-10-CM

## 2018-05-23 DIAGNOSIS — B20 Human immunodeficiency virus [HIV] disease: Secondary | ICD-10-CM

## 2018-05-23 DIAGNOSIS — E038 Other specified hypothyroidism: Secondary | ICD-10-CM

## 2018-05-23 LAB — HEPATIC FUNCTION PANEL
ALT: 19 U/L (ref 0–53)
AST: 21 U/L (ref 0–37)
Albumin: 4.3 g/dL (ref 3.5–5.2)
Alkaline Phosphatase: 71 U/L (ref 39–117)
BILIRUBIN DIRECT: 0.2 mg/dL (ref 0.0–0.3)
TOTAL PROTEIN: 8.4 g/dL — AB (ref 6.0–8.3)
Total Bilirubin: 0.9 mg/dL (ref 0.2–1.2)

## 2018-05-23 LAB — CBC WITH DIFFERENTIAL/PLATELET
Basophils Absolute: 0 10*3/uL (ref 0.0–0.1)
Basophils Relative: 0.5 % (ref 0.0–3.0)
Eosinophils Absolute: 0.2 10*3/uL (ref 0.0–0.7)
Eosinophils Relative: 3.1 % (ref 0.0–5.0)
HCT: 37.8 % — ABNORMAL LOW (ref 39.0–52.0)
Hemoglobin: 12.6 g/dL — ABNORMAL LOW (ref 13.0–17.0)
Lymphocytes Relative: 25.3 % (ref 12.0–46.0)
Lymphs Abs: 1.6 10*3/uL (ref 0.7–4.0)
MCHC: 33.4 g/dL (ref 30.0–36.0)
MCV: 96.1 fl (ref 78.0–100.0)
MONOS PCT: 6.1 % (ref 3.0–12.0)
Monocytes Absolute: 0.4 10*3/uL (ref 0.1–1.0)
Neutro Abs: 4.1 10*3/uL (ref 1.4–7.7)
Neutrophils Relative %: 65 % (ref 43.0–77.0)
Platelets: 135 10*3/uL — ABNORMAL LOW (ref 150.0–400.0)
RBC: 3.93 Mil/uL — ABNORMAL LOW (ref 4.22–5.81)
RDW: 13.6 % (ref 11.5–15.5)
WBC: 6.3 10*3/uL (ref 4.0–10.5)

## 2018-05-23 LAB — LIPID PANEL
Cholesterol: 126 mg/dL (ref 0–200)
HDL: 49.7 mg/dL (ref >39.00)
LDL Cholesterol: 61 mg/dL (ref 0–99)
NonHDL: 76.66
Total CHOL/HDL Ratio: 3
Triglycerides: 76 mg/dL (ref 0.0–149.0)
VLDL: 15.2 mg/dL (ref 0.0–40.0)

## 2018-05-23 LAB — PSA: PSA: 0.8 ng/mL (ref 0.10–4.00)

## 2018-05-23 LAB — BASIC METABOLIC PANEL
BUN: 19 mg/dL (ref 6–23)
CO2: 29 mEq/L (ref 19–32)
Calcium: 9.4 mg/dL (ref 8.4–10.5)
Chloride: 102 mEq/L (ref 96–112)
Creatinine, Ser: 1.59 mg/dL — ABNORMAL HIGH (ref 0.40–1.50)
GFR: 55.7 mL/min — ABNORMAL LOW (ref >60.00)
Glucose, Bld: 101 mg/dL — ABNORMAL HIGH (ref 70–99)
Potassium: 3.7 mEq/L (ref 3.5–5.1)
SODIUM: 138 meq/L (ref 135–145)

## 2018-05-23 LAB — TSH: TSH: 0.65 u[IU]/mL (ref 0.35–4.50)

## 2018-05-23 MED ORDER — GRISEOFULVIN MICROSIZE 500 MG PO TABS: 500.0000 mg | ORAL_TABLET | Freq: Every day | ORAL | 0 refills | Status: DC

## 2018-05-23 NOTE — Progress Notes (Signed)
   Subjective:    Patient ID: Hunter Graves, male    DOB: [birth year], 52 y.o.   MRN: [id number]  HPI CPE- UTD on pneumonia vaccines, flu shot.  Due for Tdap   Review of Systems Patient reports no vision/hearing changes, anorexia, fever ,adenopathy, persistant/recurrent hoarseness, swallowing issues, chest pain, palpitations, edema, persistant/recurrent cough, hemoptysis, dyspnea (rest,exertional, paroxysmal nocturnal), gastrointestinal  bleeding (melena, rectal bleeding), abdominal pain, excessive heart burn, GU symptoms (dysuria, hematuria, voiding/incontinence issues) syncope, focal weakness, memory loss, numbness & tingling, skin/nail changes, depression, anxiety, abnormal bruising/bleeding, musculoskeletal symptoms/signs.   + hair/scalp changes    Objective:   Physical Exam General Appearance:    Alert, cooperative, no distress, appears stated age  Head:    Normocephalic, atraumatic.  Bilateral patches of tinea capitis  Eyes:    PERRL, conjunctiva/corneas clear, EOM's intact, fundi    benign, both eyes       Ears:    Normal TM's and external ear canals, both ears  Nose:   Nares normal, septum midline, mucosa normal, no drainage   or sinus tenderness  Throat:   Lips, mucosa, and tongue normal; missing a few bottom teeth, gums normal  Neck:   Supple, symmetrical, trachea midline, no adenopathy;       thyroid:  No enlargement/tenderness/nodules  Back:     Symmetric, no curvature, ROM normal, no CVA tenderness  Lungs:     Clear to auscultation bilaterally, respirations unlabored  Chest wall:    No tenderness or deformity  Heart:    Regular rate and rhythm, S1 and S2 normal, no murmur, rub   or gallop  Abdomen:     Soft, non-tender, bowel sounds active all four quadrants,    no masses, no organomegaly  Genitalia:    Deferred at pt's request  Rectal:    Extremities:   Extremities normal, atraumatic, no cyanosis or edema  Pulses:   2+ and symmetric all extremities  Skin:   Skin  color, texture, turgor normal, no rashes or lesions  Lymph nodes:   Cervical, supraclavicular, and axillary nodes normal  Neurologic:   CNII-XII intact. Normal strength, sensation and reflexes      throughout          Assessment & Plan:

## 2018-05-23 NOTE — Assessment & Plan Note (Signed)
Ongoing issue.  Currently asymptomatic.  Check labs.  Adjust meds prn  

## 2018-05-23 NOTE — Assessment & Plan Note (Signed)
Chronic problem.  Following w/ Dr Baxter Flattery.  On Biktarvy and Dapsone.  Will follow along and assist as able.

## 2018-05-23 NOTE — Assessment & Plan Note (Signed)
Pt's PE WNL w/ exception of tinea capitis.  Tdap updated.  Check labs.  Refer to GI for colonoscopy.  Anticipatory guidance provided.

## 2018-05-23 NOTE — Assessment & Plan Note (Signed)
New.  Start Griseo.  If no improvement, will need derm referral

## 2018-05-23 NOTE — Patient Instructions (Signed)
Follow up in 1 year or as needed We'll notify you of your lab results and make any changes if needed We'll call you with your GI appt for colonoscopy consultation START the Griseofulvin daily to treat the changes on your scalp.  It can take 4-6 weeks for this to improve.  If not getting better, let me know and we can refer to derm Keep up the good work!  You look great! Call with any questions or concerns Have a great weekend!

## 2018-06-05 ENCOUNTER — Ambulatory Visit: Payer: BC Managed Care – PPO | Admitting: Internal Medicine

## 2018-07-02 ENCOUNTER — Other Ambulatory Visit: Payer: Self-pay | Admitting: Internal Medicine

## 2018-07-04 ENCOUNTER — Ambulatory Visit (INDEPENDENT_AMBULATORY_CARE_PROVIDER_SITE_OTHER): Payer: BC Managed Care – PPO | Admitting: Family

## 2018-07-04 ENCOUNTER — Encounter: Payer: Self-pay | Admitting: Family

## 2018-07-04 ENCOUNTER — Other Ambulatory Visit: Payer: Self-pay

## 2018-07-04 VITALS — BP 147/79 | HR 89 | Temp 98.4°F | Wt 149.0 lb

## 2018-07-04 DIAGNOSIS — Z0000 Encounter for general adult medical examination without abnormal findings: Secondary | ICD-10-CM

## 2018-07-04 DIAGNOSIS — B20 Human immunodeficiency virus [HIV] disease: Secondary | ICD-10-CM | POA: Diagnosis not present

## 2018-07-04 DIAGNOSIS — Z23 Encounter for immunization: Secondary | ICD-10-CM

## 2018-07-04 MED ORDER — DAPSONE 100 MG PO TABS: 100.0000 mg | ORAL_TABLET | Freq: Every day | ORAL | 0 refills | Status: DC

## 2018-07-04 MED ORDER — BICTEGRAVIR-EMTRICITAB-TENOFOV 50-200-25 MG PO TABS: 1.0000 | ORAL_TABLET | Freq: Every day | ORAL | 5 refills | Status: DC

## 2018-07-04 NOTE — Assessment & Plan Note (Signed)
   Menveo updated today  Colon cancer screening on hold secondary to coronavirus.  Discussed importance of safe sexual practice to reduce risk of acquisition and transmission of STI.  Declines condoms today.  Most recent dental exam within 2 months and up-to-date.

## 2018-07-04 NOTE — Patient Instructions (Signed)
Nice to meet you.  We will get your blood work today.  Continue to take your Dapsone and Biktarvy daily.  Plan for follow up with DR. SNIDER in 3 months or sooner if need with lab work during the same day.  Have a great day!

## 2018-07-04 NOTE — Assessment & Plan Note (Addendum)
Hunter Graves has well-controlled HIV disease with good adherence and tolerance to his ART regimen of Biktarvy supplemented with dapsone for OI prophylaxis.  He has no signs/symptoms of opportunistic infection or progressive HIV disease.  Continue current dose of Biktarvy and dapsone.  Check blood work today.  Would consider discontinuation of dapsone if CD4 count remains elevated abotu 200. Follow-up office visit in 3 months or sooner if needed.

## 2018-07-04 NOTE — Progress Notes (Signed)
Subjective:    Patient ID: Hunter Graves, male    DOB: 01-30-1966, 53 y.o.   MRN: [id number]  Chief Complaint  Patient presents with  . HIV Positive/AIDS     HPI:  Hunter Graves is a 53 y.o. male who presents today for routine follow up of HIV disease.  Hunter Graves was last seen in the office on 03/06/2018 for routine follow up with good adherence and tolerance to his ART regimen of Biktarvy supplemented with Dapsone of OI prophylaxis. Most recent blood work was completed on 03/06/18 with CD4 count of 220 and viral load of 33. Healthcare maintenance due includes Menveo.  Hunter Graves continues to take both Biktary and Dapsone as prescribed with no adverse side effects or missed doses. He is feeling well today with no complaints.  Denies fevers, chills, night sweats, headaches, changes in vision, neck pain/stiffness, nausea, diarrhea, vomiting, lesions or rashes.  Hunter Graves continues to have commercial insurance coverage and has no problems obtaining his medications from the pharmacy. 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 He is not currently sexually active. Denies feelings of being down, depressed or hopeless in the last 2 weeks. No recreational or illicit drug use. Most recent dental exam was 2 months ago and is up to date.   No Known Allergies    Outpatient Medications Prior to Visit  Medication Sig Dispense Refill  . ferrous sulfate 325 (65 FE) MG tablet TAKE 1 TABLET BY MOUTH EVERY DAY WITH BREAKFAST 90 tablet 2  . griseofulvin (GRIFULVIN V) 500 MG tablet Take 1 tablet (500 mg total) by mouth daily. 30 tablet 0  . levothyroxine (SYNTHROID, LEVOTHROID) 50 MCG tablet TAKE 1 TABLET BY MOUTH DAILY BEFORE BREAKFAST. 90 tablet 2  . bictegravir-emtricitabine-tenofovir AF (BIKTARVY) 50-200-25 MG TABS tablet Take 1 tablet by mouth daily. 30 tablet 5  . dapsone 100 MG tablet TAKE 1 TABLET BY MOUTH  EVERY DAY 90 tablet 0   No facility-administered medications prior to visit.      Past Medical History:  Diagnosis Date  . Chicken pox   . HIV (human immunodeficiency virus infection) ([place])   . Seizures ([place])    "think I had my 1st today; they are still not sure" (08/21/2017)  . Syncope and collapse    "think I had my 1st today; they are still not sure" (08/21/2017)  . Thyroid disease      Past Surgical History:  Procedure Laterality Date  . NO PAST SURGERIES         Review of Systems  Constitutional: Negative for appetite change, chills, fatigue, fever and unexpected weight change.  Eyes: Negative for visual disturbance.  Respiratory: Negative for cough, chest tightness, shortness of breath and wheezing.   Cardiovascular: Negative for chest pain and leg swelling.  Gastrointestinal: Negative for abdominal pain, constipation, diarrhea, nausea and vomiting.  Genitourinary: Negative for dysuria, flank pain, frequency, genital sores, hematuria and urgency.  Skin: Negative for rash.  Allergic/Immunologic: Negative for immunocompromised state.  Neurological: Negative for dizziness and headaches.      Objective:    BP (!) 147/79   Pulse 89   Temp 98.4 F (36.9 C)   Wt 149 lb (67.6 kg)   BMI 21.38 kg/m  Nursing note and vital signs reviewed.  Physical Exam Constitutional:      General: He is not in acute distress.    Appearance: He is well-developed.  Eyes:     Conjunctiva/sclera: Conjunctivae normal.  Neck:     Musculoskeletal: Neck supple.  Cardiovascular:     Rate and Rhythm: Normal rate and regular rhythm.     Heart sounds: Normal heart sounds. No murmur. No friction rub. No gallop.   Pulmonary:     Effort: Pulmonary effort is normal. No respiratory distress.     Breath sounds: Normal breath sounds. No wheezing or rales.  Chest:     Chest wall: No tenderness.  Abdominal:     General: Bowel sounds are normal.     Palpations: Abdomen is soft.      Tenderness: There is no abdominal tenderness.  Lymphadenopathy:     Cervical: No cervical adenopathy.  Skin:    General: Skin is warm and dry.     Findings: No rash.  Neurological:     Mental Status: He is alert and oriented to person, place, and time.  Psychiatric:        Behavior: Behavior normal.        Thought Content: Thought content normal.        Judgment: Judgment normal.        Assessment & Plan:   Problem List Items Addressed This Visit      Other   HIV disease ([place]) - Primary    [name] has well-controlled HIV disease with good adherence and tolerance to his ART regimen of Biktarvy supplemented with dapsone for OI prophylaxis.  He has no signs/symptoms of opportunistic infection or progressive HIV disease.  Continue current dose of Biktarvy and dapsone.  Check blood work today.  Would consider discontinuation of dapsone if CD4 count remains elevated abotu 200. Follow-up office visit in 3 months or sooner if needed.      Relevant Medications   bictegravir-emtricitabine-tenofovir AF (BIKTARVY) 50-200-25 MG TABS tablet   dapsone 100 MG tablet   Other Relevant Orders   COMPLETE METABOLIC PANEL WITH GFR   T-helper cell (CD4)- ([facility] clinic only)   HIV-1 RNA quant-no reflex-bld   MENINGOCOCCAL MCV4O (Completed)   Healthcare maintenance     Menveo updated today  Colon cancer screening on hold secondary to coronavirus.  Discussed importance of safe sexual practice to reduce risk of acquisition and transmission of STI.  Declines condoms today.  Most recent dental exam within 2 months and up-to-date.       Other Visit Diagnoses    Need for meningococcus vaccine       Relevant Orders   MENINGOCOCCAL MCV4O (Completed)       I have changed Hunter Graves's dapsone. I am also having him maintain his levothyroxine, ferrous sulfate, griseofulvin, and bictegravir-emtricitabine-tenofovir AF.   Meds ordered this encounter  Medications  .  bictegravir-emtricitabine-tenofovir AF (BIKTARVY) 50-200-25 MG TABS tablet    Sig: Take 1 tablet by mouth daily.    Dispense:  30 tablet    Refill:  5    Order Specific Question:   Supervising Provider    Answer:   Carlyle Basques [[id number]]  . dapsone 100 MG tablet    Sig: Take 1 tablet (100 mg total) by mouth daily.    Dispense:  90 tablet    Refill:  0    Order Specific Question:   Supervising Provider    Answer:   Carlyle Basques [[id number]]     Follow-up: Return in about 3 months (around 10/03/2018), or if symptoms worsen or fail to improve.   Terri Piedra, MSN, FNP-C Nurse Practitioner [facility]  for Infectious Disease [place] number: [phone]

## 2018-07-05 LAB — T-HELPER CELL (CD4) - (RCID CLINIC ONLY)
CD4 % Helper T Cell: 13 % — ABNORMAL LOW (ref 33–55)
CD4 T Cell Abs: 240 /uL — ABNORMAL LOW (ref 400–2700)

## 2018-07-09 LAB — COMPLETE METABOLIC PANEL WITH GFR
AG Ratio: 1.1 (calc) (ref 1.0–2.5)
ALT: 13 U/L (ref 9–46)
AST: 17 U/L (ref 10–35)
Albumin: 4.4 g/dL (ref 3.6–5.1)
Alkaline phosphatase (APISO): 63 U/L (ref 35–144)
BUN/Creatinine Ratio: 12 (calc) (ref 6–22)
BUN: 21 mg/dL (ref 7–25)
CO2: 25 mmol/L (ref 20–32)
Calcium: 9.6 mg/dL (ref 8.6–10.3)
Chloride: 105 mmol/L (ref 98–110)
Creat: 1.72 mg/dL — ABNORMAL HIGH (ref 0.70–1.33)
GFR, Est African American: 52 mL/min/{1.73_m2} — ABNORMAL LOW (ref >=60)
GFR, Est Non African American: 45 mL/min/{1.73_m2} — ABNORMAL LOW (ref >=60)
Globulin: 4 g/dL (calc) — ABNORMAL HIGH (ref 1.9–3.7)
Glucose, Bld: 90 mg/dL (ref 65–99)
Potassium: 3.9 mmol/L (ref 3.5–5.3)
Sodium: 138 mmol/L (ref 135–146)
Total Bilirubin: 0.9 mg/dL (ref 0.2–1.2)
Total Protein: 8.4 g/dL — ABNORMAL HIGH (ref 6.1–8.1)

## 2018-07-09 LAB — HIV-1 RNA QUANT-NO REFLEX-BLD
HIV 1 RNA Quant: 26 copies/mL — ABNORMAL HIGH
HIV-1 RNA Quant, Log: 1.41 Log copies/mL — ABNORMAL HIGH

## 2018-07-10 ENCOUNTER — Telehealth: Payer: Self-pay | Admitting: Behavioral Health

## 2018-07-10 NOTE — Telephone Encounter (Signed)
Called patient, verified identity.  Informed patient per Terri Piedra that his HIV viral load is undetectable and his CD4 count is 240.  His kidney function remains stable.  Continue taking Biktarvy as prescribed and follow up with Dr. Baxter Flattery in July as planned.  Patient verbalized understanding and was appreciative of the call. Pricilla Riffle RN

## 2018-07-10 NOTE — Telephone Encounter (Signed)
-----   Message from [place], [place] sent at 07/09/2018  5:14 PM EDT ----- Please inform Mr. Chaikin that his viral load is undetectable and CD4 count is 240. His kidney function remains stable at present. Continue to take Biktarvy as prescribed and follow up with Dr. Baxter Flattery in July.

## 2018-10-09 ENCOUNTER — Ambulatory Visit: Payer: BC Managed Care – PPO | Admitting: Internal Medicine

## 2018-10-10 ENCOUNTER — Ambulatory Visit: Payer: BC Managed Care – PPO | Admitting: Internal Medicine

## 2018-10-14 ENCOUNTER — Encounter: Payer: Self-pay | Admitting: Internal Medicine

## 2018-10-14 ENCOUNTER — Other Ambulatory Visit: Payer: Self-pay

## 2018-10-14 ENCOUNTER — Ambulatory Visit: Payer: BC Managed Care – PPO | Admitting: Internal Medicine

## 2018-10-14 VITALS — BP 120/81 | HR 90 | Temp 98.2°F | Wt 153.0 lb

## 2018-10-14 DIAGNOSIS — N183 Chronic kidney disease, stage 3 unspecified: Secondary | ICD-10-CM

## 2018-10-14 DIAGNOSIS — Z79899 Other long term (current) drug therapy: Secondary | ICD-10-CM | POA: Diagnosis not present

## 2018-10-14 DIAGNOSIS — B20 Human immunodeficiency virus [HIV] disease: Secondary | ICD-10-CM

## 2018-10-14 NOTE — Progress Notes (Signed)
RFV: follow up for hiv disease  Patient ID: Hunter Graves, male   DOB: 08/30/1965, 53 y.o.   MRN: [id number]  HPI Hunter Graves is a 53yo M with hiv disease, CD 4 count 240/VL 26 in April. Currently on biktarvy. Has great adherence. No recent sick contacts nor has he had any need to go visit urgent care or ed. [place], doing welll  Outpatient Encounter Medications as of 10/14/2018  Medication Sig  . bictegravir-emtricitabine-tenofovir AF (BIKTARVY) 50-200-25 MG TABS tablet Take 1 tablet by mouth daily.  . dapsone 100 MG tablet Take 1 tablet (100 mg total) by mouth daily.  . ferrous sulfate 325 (65 FE) MG tablet TAKE 1 TABLET BY MOUTH EVERY DAY WITH BREAKFAST  . levothyroxine (SYNTHROID, LEVOTHROID) 50 MCG tablet TAKE 1 TABLET BY MOUTH DAILY BEFORE BREAKFAST.  Marland Kitchen griseofulvin (GRIFULVIN V) 500 MG tablet Take 1 tablet (500 mg total) by mouth daily. (Patient not taking: Reported on 10/14/2018)   No facility-administered encounter medications on file as of 10/14/2018.      Patient Active Problem List   Diagnosis Date Noted  . Healthcare maintenance 05/23/2018  . Tinea capitis 05/23/2018  . HIV disease ([place])   . Other specified hypothyroidism   . Microcytic anemia   . Elevated serum creatinine   . Hypomagnesemia      Health Maintenance Due  Topic Date Due  . COLONOSCOPY  11/16/2016     Review of Systems Review of Systems  Constitutional: Negative for fever, chills, diaphoresis, activity change, appetite change, fatigue and unexpected weight change.  HENT: Negative for congestion, sore throat, rhinorrhea, sneezing, trouble swallowing and sinus pressure.  Eyes: Negative for photophobia and visual disturbance.  Respiratory: Negative for cough, chest tightness, shortness of breath, wheezing and stridor.  Cardiovascular: Negative for chest pain, palpitations and leg swelling.  Gastrointestinal: Negative for nausea, vomiting, abdominal pain, diarrhea, constipation, blood in stool,  abdominal distention and anal bleeding.  Genitourinary: Negative for dysuria, hematuria, flank pain and difficulty urinating.  Musculoskeletal: Negative for myalgias, back pain, joint swelling, arthralgias and gait problem.  Skin: Negative for color change, pallor, rash and wound.  Neurological: Negative for dizziness, tremors, weakness and light-headedness.  Hematological: Negative for adenopathy. Does not bruise/bleed easily.  Psychiatric/Behavioral: Negative for behavioral problems, confusion, sleep disturbance, dysphoric mood, decreased concentration and agitation.   Social History   Tobacco Use  . Smoking status: Never Smoker  . Smokeless tobacco: Never Used  Substance Use Topics  . Alcohol use: Not Currently    Comment: 08/21/2017 "1 drink q 6 months or less"  . Drug use: Not Currently   Physical Exam   BP 120/81   Pulse 90   Temp 98.2 F (36.8 C) (Oral)   Wt 153 lb (69.4 kg)   BMI 21.95 kg/m   Physical Exam  Constitutional: He is oriented to person, place, and time. He appears well-developed and well-nourished. No distress.  HENT:  Mouth/Throat: Oropharynx is clear and moist. No oropharyngeal exudate.  Cardiovascular: Normal rate, regular rhythm and normal heart sounds. Exam reveals no gallop and no friction rub.  No murmur heard.  Pulmonary/Chest: Effort normal and breath sounds normal. No respiratory distress. He has no wheezes.  Abdominal: Soft. Bowel sounds are normal. He exhibits no distension. There is no tenderness.  Lymphadenopathy:  He has no cervical adenopathy.  Neurological: He is alert and oriented to person, place, and time.  Skin: Skin is warm and dry. No rash noted. No erythema.  Psychiatric: He has  a normal mood and affect. His behavior is normal.    Lab Results  Component Value Date   CD4TCELL 13 (L) 07/04/2018   Lab Results  Component Value Date   CD4TABS 240 (L) 07/04/2018   CD4TABS 220 (L) 03/06/2018   CD4TABS 170 (L) 11/14/2017   Lab  Results  Component Value Date   HIV1RNAQUANT 26 (H) 07/04/2018   Lab Results  Component Value Date   HEPBSAB Non Reactive 08/24/2017   Lab Results  Component Value Date   LABRPR NON-REACTIVE 08/29/2017    CBC Lab Results  Component Value Date   WBC 6.3 05/23/2018   RBC 3.93 (L) 05/23/2018   HGB 12.6 (L) 05/23/2018   HCT 37.8 (L) 05/23/2018   PLT 135.0 (L) 05/23/2018   MCV 96.1 05/23/2018   MCH 31.7 03/06/2018   MCHC 33.4 05/23/2018   RDW 13.6 05/23/2018   LYMPHSABS 1.6 05/23/2018   MONOABS 0.4 05/23/2018   EOSABS 0.2 05/23/2018    BMET Lab Results  Component Value Date   NA 138 07/04/2018   K 3.9 07/04/2018   CL 105 07/04/2018   CO2 25 07/04/2018   GLUCOSE 90 07/04/2018   BUN 21 07/04/2018   CREATININE 1.72 (H) 07/04/2018   CALCIUM 9.6 07/04/2018   GFRNONAA 45 (L) 07/04/2018   GFRAA 52 (L) 07/04/2018      Assessment and Plan  hiv disease= overall, Looking good . Continue on biktarvy, plan on getting labs at next visit  ckd 3 = continue ot monitor but appears stable  Long term medication management= no need to change meds if cr stable at his baseline Will see backin 3 mo

## 2018-10-20 ENCOUNTER — Other Ambulatory Visit: Payer: Self-pay

## 2018-10-21 MED ORDER — DAPSONE 100 MG PO TABS: 100.0000 mg | ORAL_TABLET | Freq: Every day | ORAL | 0 refills | Status: DC

## 2018-11-18 ENCOUNTER — Other Ambulatory Visit: Payer: Self-pay | Admitting: Family

## 2018-11-18 DIAGNOSIS — B20 Human immunodeficiency virus [HIV] disease: Secondary | ICD-10-CM

## 2018-12-16 ENCOUNTER — Encounter: Payer: Self-pay | Admitting: Family Medicine

## 2018-12-24 ENCOUNTER — Other Ambulatory Visit: Payer: Self-pay | Admitting: Family Medicine

## 2018-12-29 ENCOUNTER — Other Ambulatory Visit: Payer: Self-pay | Admitting: Family Medicine

## 2019-01-12 ENCOUNTER — Other Ambulatory Visit: Payer: Self-pay | Admitting: Internal Medicine

## 2019-01-15 ENCOUNTER — Encounter: Payer: Self-pay | Admitting: Family Medicine

## 2019-01-27 ENCOUNTER — Emergency Department (HOSPITAL_COMMUNITY)
Admission: EM | Admit: 2019-01-27 | Discharge: 2019-01-27 | Disposition: A | Discharge status: Home / Self Care | Payer: BC Managed Care – PPO | Attending: Emergency Medicine | Admitting: Emergency Medicine

## 2019-01-27 ENCOUNTER — Encounter: Payer: Self-pay | Admitting: Internal Medicine

## 2019-01-27 ENCOUNTER — Other Ambulatory Visit: Payer: Self-pay

## 2019-01-27 ENCOUNTER — Ambulatory Visit (INDEPENDENT_AMBULATORY_CARE_PROVIDER_SITE_OTHER): Payer: BC Managed Care – PPO | Admitting: Internal Medicine

## 2019-01-27 VITALS — BP 152/99 | HR 96 | Temp 98.3°F | Wt 161.8 lb

## 2019-01-27 DIAGNOSIS — N183 Chronic kidney disease, stage 3 unspecified: Secondary | ICD-10-CM

## 2019-01-27 DIAGNOSIS — B20 Human immunodeficiency virus [HIV] disease: Secondary | ICD-10-CM | POA: Diagnosis not present

## 2019-01-27 DIAGNOSIS — R55 Syncope and collapse: Secondary | ICD-10-CM | POA: Diagnosis not present

## 2019-01-27 DIAGNOSIS — Z23 Encounter for immunization: Secondary | ICD-10-CM | POA: Diagnosis not present

## 2019-01-27 DIAGNOSIS — Z21 Asymptomatic human immunodeficiency virus [HIV] infection status: Secondary | ICD-10-CM | POA: Insufficient documentation

## 2019-01-27 DIAGNOSIS — Z79899 Other long term (current) drug therapy: Secondary | ICD-10-CM | POA: Insufficient documentation

## 2019-01-27 LAB — COMPREHENSIVE METABOLIC PANEL
ALT: 16 U/L (ref 0–44)
ALT: 19 U/L (ref 0–44)
AST: 20 U/L (ref 15–41)
AST: 25 U/L (ref 15–41)
Albumin: 4 g/dL (ref 3.5–5.0)
Albumin: 4.2 g/dL (ref 3.5–5.0)
Alkaline Phosphatase: 53 U/L (ref 38–126)
Alkaline Phosphatase: 56 U/L (ref 38–126)
Anion gap: 6 (ref 5–15)
Anion gap: 15 (ref 5–15)
BUN: 17 mg/dL (ref 6–20)
BUN: 16 mg/dL (ref 6–20)
CO2: 27 mmol/L (ref 22–32)
CO2: 17 mmol/L — ABNORMAL LOW (ref 22–32)
Calcium: 9.1 mg/dL (ref 8.9–10.3)
Calcium: 9.3 mg/dL (ref 8.9–10.3)
Chloride: 105 mmol/L (ref 98–111)
Chloride: 103 mmol/L (ref 98–111)
Creatinine, Ser: 1.71 mg/dL — ABNORMAL HIGH (ref 0.61–1.24)
Creatinine, Ser: 1.82 mg/dL — ABNORMAL HIGH (ref 0.61–1.24)
GFR calc Af Amer: 52 mL/min — ABNORMAL LOW (ref >60)
GFR calc Af Amer: 48 mL/min — ABNORMAL LOW (ref >60)
GFR calc non Af Amer: 45 mL/min — ABNORMAL LOW (ref >60)
GFR calc non Af Amer: 42 mL/min — ABNORMAL LOW (ref >60)
Glucose, Bld: 98 mg/dL (ref 70–99)
Glucose, Bld: 149 mg/dL — ABNORMAL HIGH (ref 70–99)
Potassium: 4.2 mmol/L (ref 3.5–5.1)
Potassium: 3.6 mmol/L (ref 3.5–5.1)
Sodium: 138 mmol/L (ref 135–145)
Sodium: 135 mmol/L (ref 135–145)
Total Bilirubin: 1.2 mg/dL (ref 0.3–1.2)
Total Bilirubin: 1.4 mg/dL — ABNORMAL HIGH (ref 0.3–1.2)
Total Protein: 7.7 g/dL (ref 6.5–8.1)
Total Protein: 8.1 g/dL (ref 6.5–8.1)

## 2019-01-27 LAB — CBC WITH DIFFERENTIAL/PLATELET
Abs Immature Granulocytes: 0.01 10*3/uL (ref 0.00–0.07)
Abs Immature Granulocytes: 0.11 10*3/uL — ABNORMAL HIGH (ref 0.00–0.07)
Basophils Absolute: 0 10*3/uL (ref 0.0–0.1)
Basophils Absolute: 0.1 10*3/uL (ref 0.0–0.1)
Basophils Relative: 0 %
Basophils Relative: 0 %
Eosinophils Absolute: 0.2 10*3/uL (ref 0.0–0.5)
Eosinophils Absolute: 0.1 10*3/uL (ref 0.0–0.5)
Eosinophils Relative: 3 %
Eosinophils Relative: 1 %
HCT: 39.8 % (ref 39.0–52.0)
HCT: 39.9 % (ref 39.0–52.0)
Hemoglobin: 12.4 g/dL — ABNORMAL LOW (ref 13.0–17.0)
Hemoglobin: 12.6 g/dL — ABNORMAL LOW (ref 13.0–17.0)
Immature Granulocytes: 0 %
Immature Granulocytes: 1 %
Lymphocytes Relative: 26 %
Lymphocytes Relative: 13 %
Lymphs Abs: 1.8 10*3/uL (ref 0.7–4.0)
Lymphs Abs: 2.1 10*3/uL (ref 0.7–4.0)
MCH: 30.7 pg (ref 26.0–34.0)
MCH: 31 pg (ref 26.0–34.0)
MCHC: 31.2 g/dL (ref 30.0–36.0)
MCHC: 31.6 g/dL (ref 30.0–36.0)
MCV: 98.5 fL (ref 80.0–100.0)
MCV: 98.3 fL (ref 80.0–100.0)
Monocytes Absolute: 0.5 10*3/uL (ref 0.1–1.0)
Monocytes Absolute: 1.1 10*3/uL — ABNORMAL HIGH (ref 0.1–1.0)
Monocytes Relative: 8 %
Monocytes Relative: 7 %
Neutro Abs: 4.3 10*3/uL (ref 1.7–7.7)
Neutro Abs: 12.4 10*3/uL — ABNORMAL HIGH (ref 1.7–7.7)
Neutrophils Relative %: 63 %
Neutrophils Relative %: 78 %
Platelets: 153 10*3/uL (ref 150–400)
Platelets: 177 10*3/uL (ref 150–400)
RBC: 4.04 MIL/uL — ABNORMAL LOW (ref 4.22–5.81)
RBC: 4.06 MIL/uL — ABNORMAL LOW (ref 4.22–5.81)
RDW: 13.2 % (ref 11.5–15.5)
RDW: 12.9 % (ref 11.5–15.5)
WBC: 6.8 10*3/uL (ref 4.0–10.5)
WBC: 15.8 10*3/uL — ABNORMAL HIGH (ref 4.0–10.5)
nRBC: 0 % (ref 0.0–0.2)
nRBC: 0 % (ref 0.0–0.2)

## 2019-01-27 LAB — PROTIME-INR
INR: 1.1 (ref 0.8–1.2)
Prothrombin Time: 14.5 seconds (ref 11.4–15.2)

## 2019-01-27 LAB — TROPONIN I (HIGH SENSITIVITY)
Troponin I (High Sensitivity): 4 ng/L (ref <18)
Troponin I (High Sensitivity): 3 ng/L (ref <18)

## 2019-01-27 LAB — CBG MONITORING, ED: Glucose-Capillary: 95 mg/dL (ref 70–99)

## 2019-01-27 LAB — APTT: aPTT: 24 seconds (ref 24–36)

## 2019-01-27 SURGERY — LEFT HEART CATH
Anesthesia: LOCAL

## 2019-01-27 MED ORDER — ASPIRIN 81 MG PO CHEW
CHEWABLE_TABLET | ORAL | Status: AC
  Filled 2019-01-27: qty 4

## 2019-01-27 MED ORDER — ASPIRIN 81 MG PO CHEW
324.0000 mg | CHEWABLE_TABLET | Freq: Once | ORAL | Status: AC
  Administered 2019-01-27: 324 mg via ORAL

## 2019-01-27 MED ORDER — ONDANSETRON HCL 4 MG/2ML IJ SOLN
INTRAMUSCULAR | Status: AC
  Filled 2019-01-27: qty 2

## 2019-01-27 NOTE — Progress Notes (Signed)
Chaplain responded to [facility].  Chaplain was told by nurse after asking Hunter Graves that family should be on the way.  Code [facility]emi was canceled shortly after arrival.  [name] is not needed at this time.  Chaplain will follow-up if needed.

## 2019-01-27 NOTE — Discharge Instructions (Addendum)
°  All of our cardiac workup is normal, including labs, EKG and chest X-RAY are normal. We are not sure what is causing your discomfort, but we feel comfortable sending you home at this time. The workup in the ER is not complete, and you should follow up with your primary care doctor for further evaluation. Return to the ER if your symptoms get worse.

## 2019-01-27 NOTE — Progress Notes (Signed)
RFV: follow up for hiv disease  Patient ID: Hunter Graves, male   DOB: 08-31-1965, 53 y.o.   MRN: [id number]  HPI  67yoM with advanced hiv disease, CD 4 count of 240/VL 26 (April 2020) on biktarvy.  Overall good health. No close contacts to covid. Wears masks when in public, staying to himself. 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  Eating well. Feeling good  ----------------------- Addendum: patient is known to have fainting spells with blood draw, thus he was supine for bloodwork, he has not had syncope in clinic in the past 18 months. Today, he loss consciousness < 1 min profusely diaphoretic with loss of bladder. Sternal rub to see if can open eyes. Post spell- he had 3 episodes of nonbilious emesis. Denies chest pain, but nauseated when moving from supine to 30 degree  After 1 hour of observation and repeat vitals, not improved, thus had EMS called to transport to ED for further work up (specifically cardiac work up, we did not do any EKG in the clinic)  Did not attempt orthostatics due to being symptomatic  Outpatient Encounter Medications as of 01/27/2019  Medication Sig  . BIKTARVY 50-200-25 MG TABS tablet TAKE 1 TABLET BY MOUTH DAILY.  . dapsone 100 MG tablet TAKE 1 TABLET BY MOUTH EVERY DAY  . ferrous sulfate 325 (65 FE) MG tablet TAKE 1 TABLET BY MOUTH EVERY DAY WITH BREAKFAST  . griseofulvin (GRIFULVIN V) 500 MG tablet Take 1 tablet (500 mg total) by mouth daily. (Patient not taking: Reported on 10/14/2018)  . levothyroxine (SYNTHROID) 50 MCG tablet TAKE 1 TABLET BY MOUTH EVERY DAY BEFORE BREAKFAST   No facility-administered encounter medications on file as of 01/27/2019.      Patient Active Problem List   Diagnosis Date Noted  . Healthcare maintenance 05/23/2018  . Tinea capitis 05/23/2018  . HIV disease ([place])   . Other specified hypothyroidism   . Microcytic anemia   . Elevated serum creatinine   . Hypomagnesemia      Health  Maintenance Due  Topic Date Due  . COLONOSCOPY  11/16/2016  . INFLUENZA VACCINE  10/26/2018     Review of Systems Review of Systems  Constitutional: Negative for fever, chills, diaphoresis, activity change, appetite change, fatigue and unexpected weight change.  HENT: Negative for congestion, sore throat, rhinorrhea, sneezing, trouble swallowing and sinus pressure.  Eyes: Negative for photophobia and visual disturbance.  Respiratory: Negative for cough, chest tightness, shortness of breath, wheezing and stridor.  Cardiovascular: Negative for chest pain, palpitations and leg swelling.  Gastrointestinal: Negative for nausea, vomiting, abdominal pain, diarrhea, constipation, blood in stool, abdominal distention and anal bleeding.  Genitourinary: Negative for dysuria, hematuria, flank pain and difficulty urinating.  Musculoskeletal: Negative for myalgias, back pain, joint swelling, arthralgias and gait problem.  Skin: Negative for color change, pallor, rash and wound.  Neurological: Negative for dizziness, tremors, weakness and light-headedness.  Hematological: Negative for adenopathy. Does not bruise/bleed easily.  Psychiatric/Behavioral: Negative for behavioral problems, confusion, sleep disturbance, dysphoric mood, decreased concentration and agitation.    Physical Exam   BP (!) 152/99   Pulse 96   Temp 98.3 F (36.8 C)   Wt 161 lb 12.8 oz (73.4 kg)   BMI 23.22 kg/m   Physical Exam  Constitutional: He is oriented to person, place, and time. He appears well-developed and well-nourished. No distress.  HENT:  Mouth/Throat: Oropharynx is clear and moist. No oropharyngeal exudate.  Cardiovascular: Normal rate, regular rhythm  and normal heart sounds. Exam reveals no gallop and no friction rub.  No murmur heard.  Pulmonary/Chest: Effort normal and breath sounds normal. No respiratory distress. He has no wheezes.  Abdominal: Soft. Bowel sounds are normal. He exhibits no distension. There  is no tenderness.  Lymphadenopathy:  He has no cervical adenopathy.  Neurological: He is alert and oriented to person, place, and time.  Skin: Skin is warm and dry. No rash noted. No erythema.  Psychiatric: He has a normal mood and affect. His behavior is normal.    Lab Results  Component Value Date   CD4TCELL 13 (L) 07/04/2018   Lab Results  Component Value Date   CD4TABS 240 (L) 07/04/2018   CD4TABS 220 (L) 03/06/2018   CD4TABS 170 (L) 11/14/2017   Lab Results  Component Value Date   HIV1RNAQUANT 26 (H) 07/04/2018   Lab Results  Component Value Date   HEPBSAB Non Reactive 08/24/2017   Lab Results  Component Value Date   LABRPR NON-REACTIVE 08/29/2017    CBC Lab Results  Component Value Date   WBC 6.3 05/23/2018   RBC 3.93 (L) 05/23/2018   HGB 12.6 (L) 05/23/2018   HCT 37.8 (L) 05/23/2018   PLT 135.0 (L) 05/23/2018   MCV 96.1 05/23/2018   MCH 31.7 03/06/2018   MCHC 33.4 05/23/2018   RDW 13.6 05/23/2018   LYMPHSABS 1.6 05/23/2018   MONOABS 0.4 05/23/2018   EOSABS 0.2 05/23/2018    BMET Lab Results  Component Value Date   NA 138 07/04/2018   K 3.9 07/04/2018   CL 105 07/04/2018   CO2 25 07/04/2018   GLUCOSE 90 07/04/2018   BUN 21 07/04/2018   CREATININE 1.72 (H) 07/04/2018   CALCIUM 9.6 07/04/2018   GFRNONAA 45 (L) 07/04/2018   GFRAA 52 (L) 07/04/2018      Assessment and Plan  prehtn = rushing into clinic  Syncope with blood draws = will lay in the room gurney for blood work  ---> please see note above since patient did have prolonged symptoms post phlebotomy  Hx of pituitary microadenoma = recommend repeat brain mri for evaluation  ckd3 = will check cr  hiv = can stop dapsone for oi proph, continue biktarvy  --------------- Addendum => EMS called to transport to ED for further management. Likely dehydration  Spent 60 min in direct patient care

## 2019-01-27 NOTE — ED Provider Notes (Signed)
[facility] EMERGENCY DEPARTMENT Provider Note   CSN: [id number] Arrival date & time: 01/27/19  1305     History   Chief Complaint Chief Complaint  Patient presents with  . Code STEMI    HPI Hunter Graves is a 53 y.o. male.     HPI Patient presents after an episode of syncope, with nausea, lightheadedness.  Patient acknowledges multiple medical issues, is awake and alert on arrival, but history is provided by him and EMS providers. He notes that he was in his usual state of health when he went for outpatient lab draw for evaluation of his HIV status. While there the patient had episode of syncope. Patient notes that he has had similar episodes in the past typically with blood draw. Either before or after the episode today complaint of chest pain, nor does he have any right now. Following awakening from his episode of syncope patient was found to be diaphoretic, complained of lightheadedness, had several episodes of vomiting and shaking. He received Zofran, fluids.  On with EKG abnormalities he was designated as a code STEMI and brought here for evaluation.  Past Medical History:  Diagnosis Date  . Chicken pox   . HIV (human immunodeficiency virus infection) ([place])   . Seizures ([place])    "think I had my 1st today; they are still not sure" (08/21/2017)  . Syncope and collapse    "think I had my 1st today; they are still not sure" (08/21/2017)  . Thyroid disease     Patient Active Problem List   Diagnosis Date Noted  . Healthcare maintenance 05/23/2018  . Tinea capitis 05/23/2018  . HIV disease ([place])   . Other specified hypothyroidism   . Microcytic anemia   . Elevated serum creatinine   . Hypomagnesemia     Past Surgical History:  Procedure Laterality Date  . NO PAST SURGERIES          Home Medications    Prior to Admission medications   Medication Sig Start Date End Date Taking? Authorizing Provider  BIKTARVY 50-200-25 MG TABS tablet TAKE  1 TABLET BY MOUTH DAILY. 11/19/18   Carlyle Basques, MD  dapsone 100 MG tablet TAKE 1 TABLET BY MOUTH EVERY DAY 01/13/19   Carlyle Basques, MD  ferrous sulfate 325 (65 FE) MG tablet TAKE 1 TABLET BY MOUTH EVERY DAY WITH BREAKFAST 12/24/18   Midge Minium, MD  griseofulvin (GRIFULVIN V) 500 MG tablet Take 1 tablet (500 mg total) by mouth daily. Patient not taking: Reported on 10/14/2018 05/23/18   Midge Minium, MD  levothyroxine (SYNTHROID) 50 MCG tablet TAKE 1 TABLET BY MOUTH EVERY DAY BEFORE BREAKFAST 12/30/18   Midge Minium, MD    Family History Family History  Problem Relation Age of Onset  . Arthritis Mother   . Thyroid disease Mother   . Hypertension Mother   . Depression Mother   . Arthritis Maternal Grandmother   . Dementia Maternal Grandmother   . Diabetes Maternal Grandfather   . Hypertension Maternal Grandfather   . Emphysema Maternal Grandfather   . Cancer Maternal Aunt        breast  . Cancer Maternal Aunt 52       inflammatory breast cancer  . Cancer Maternal Aunt        breast cancer  . Cancer Cousin 62       breast    Social History Social History   Tobacco Use  . Smoking status: Never Smoker  .  Smokeless tobacco: Never Used  Substance Use Topics  . Alcohol use: Not Currently    Comment: 08/21/2017 "1 drink q 6 months or less"  . Drug use: Not Currently     Allergies   Patient has no known allergies.   Review of Systems Review of Systems  Constitutional:       Per HPI, otherwise negative  HENT:       Per HPI, otherwise negative  Respiratory:       Per HPI, otherwise negative  Cardiovascular:       Per HPI, otherwise negative  Gastrointestinal: Positive for nausea and vomiting. Negative for abdominal pain.  Endocrine:       Negative aside from HPI  Genitourinary:       Neg aside from HPI   Musculoskeletal:       Per HPI, otherwise negative  Skin: Negative.   Allergic/Immunologic: Positive for immunocompromised state.   Neurological: Positive for weakness. Negative for syncope.     Physical Exam Updated Vital Signs BP 118/81   Pulse 79   Resp 18   SpO2 98%   Physical Exam Vitals signs and nursing note reviewed.  Constitutional:      Appearance: He is well-developed. He is ill-appearing and diaphoretic.  HENT:     Head: Normocephalic and atraumatic.  Eyes:     Conjunctiva/sclera: Conjunctivae normal.  Cardiovascular:     Rate and Rhythm: Normal rate and regular rhythm.  Pulmonary:     Effort: Pulmonary effort is normal. No respiratory distress.     Breath sounds: No stridor.  Abdominal:     General: There is no distension.  Skin:    General: Skin is warm.  Neurological:     Mental Status: He is alert and oriented to person, place, and time.      ED Treatments / Results  Labs (all labs ordered are listed, but only abnormal results are displayed) Labs Reviewed  CBC WITH DIFFERENTIAL/PLATELET - Abnormal; Notable for the following components:      Result Value   WBC 15.8 (*)    RBC 4.06 (*)    Hemoglobin 12.6 (*)    Neutro Abs 12.4 (*)    Monocytes Absolute 1.1 (*)    Abs Immature Granulocytes 0.11 (*)    All other components within normal limits  COMPREHENSIVE METABOLIC PANEL - Abnormal; Notable for the following components:   CO2 17 (*)    Glucose, Bld 149 (*)    Creatinine, Ser 1.82 (*)    Total Bilirubin 1.4 (*)    GFR calc non Af Amer 42 (*)    GFR calc Af Amer 48 (*)    All other components within normal limits  PROTIME-INR  APTT  CBG MONITORING, ED  TROPONIN I (HIGH SENSITIVITY)  TROPONIN I (HIGH SENSITIVITY)    EKG EKG Interpretation  Date/Time:  Monday January 27 2019 13:12:35 EST Ventricular Rate:  91 PR Interval:    QRS Duration: 84 QT Interval:  369 QTC Calculation: 454 R Axis:   65 Text Interpretation: Sinus rhythm ST elev, probable normal early repol pattern Baseline wander Abnormal ECG Confirmed by Carmin Muskrat [phone]) on 01/27/2019 1:30:57 PM    Procedures Procedures (including critical care time)  Medications Ordered in ED Medications  ondansetron (ZOFRAN) 4 MG/2ML injection (has no administration in time range)  aspirin chewable tablet 324 mg (324 mg Oral Given 01/27/19 1320)     Initial Impression / Assessment and Plan / ED Course  I have  reviewed the triage vital signs and the nursing notes.  Pertinent labs & imaging results that were available during my care of the patient were reviewed by me and considered in my medical decision making (see chart for details).    I reviewed the patient's rhythm strips from EMS providers, and EKG from prior to hospital transport, both notable for ST changes in the precordial leads.  Here, soon after arrival patient received aspirin, fluids, and I discussed this case with her cardiology colleagues. Patient's code STEMI was canceled, due to similarity with prior EKG, and his denial of any ongoing chest pain.    3:42 PM Patient in no distress, no chest pain, hemodynamically unremarkable.  Initial troponin normal, labs reassuring, no ongoing complaints. Though the patient presented as a code STEMI, with no troponin elevation, and no chest pain, there is low suspicion for ongoing coronary ischemia. Some suspicion for vagal episode, given the patient's description of syncope following blood draw. With no other alarming findings, should the patient's second upon return normal, he may be appropriate for discharge, though observation may be considered as well. Dr. Kathrynn Humble is aware of the patient and will follow-up as needed   Final Clinical Impressions(s) / ED Diagnoses   Final diagnoses:  Syncope and collapse     Carmin Muskrat, MD 01/27/19 1546

## 2019-01-27 NOTE — ED Triage Notes (Signed)
Pt in as STEMI from doc's office. Pain and nausea began after blood draw. Elevation in V2, 3 and 4 per EMS

## 2019-01-27 NOTE — ED Notes (Signed)
Patient verbalizes understanding of discharge instructions. Opportunity for questioning and answers were provided. Armband removed by staff, pt discharged from ED. Wheeled out to lobby  

## 2019-01-28 LAB — T-HELPER CELL (CD4) - (RCID CLINIC ONLY)
CD4 % Helper T Cell: 15 % — ABNORMAL LOW (ref 33–65)
CD4 T Cell Abs: 248 /uL — ABNORMAL LOW (ref 400–1790)

## 2019-01-29 ENCOUNTER — Ambulatory Visit: Payer: BC Managed Care – PPO | Admitting: Family Medicine

## 2019-01-30 ENCOUNTER — Encounter: Payer: Self-pay | Admitting: Family Medicine

## 2019-01-30 ENCOUNTER — Ambulatory Visit: Payer: BC Managed Care – PPO | Admitting: Family Medicine

## 2019-01-30 ENCOUNTER — Other Ambulatory Visit: Payer: Self-pay

## 2019-01-30 VITALS — BP 113/81 | HR 87 | Temp 98.7°F | Resp 16 | Ht 70.0 in | Wt 160.4 lb

## 2019-01-30 DIAGNOSIS — R55 Syncope and collapse: Secondary | ICD-10-CM | POA: Diagnosis not present

## 2019-01-30 DIAGNOSIS — Z1211 Encounter for screening for malignant neoplasm of colon: Secondary | ICD-10-CM

## 2019-01-30 NOTE — Progress Notes (Signed)
   Subjective:    Patient ID: Hunter Graves, male    DOB: January 13, 1966, 53 y.o.   MRN: [id number]  HPI ER f/u- pt had a vagal episode at ID when they drew his blood on 11/2.  He has hx of similar episodes but this time was concerning for loss of bladder control, dizziness, chest tightness, vomiting.  He was monitored for ~1 hr and then transported to ED w/ possible 'Code STEMI' due to abnormal EKG (ST changes in precordial leads)  This was cancelled due to similarities in pt's previous EKGs.  Normal Troponin.  Pt reports feeling better.  Admits not enough water intake.  Pt is eating 3 meals/day.  Not feeling dizzy, no nausea.  Not feeling shaky.  Pt is due for colon cancer screen- colonoscopy got cancelled.  Will do Cologuard as pt is low risk.   Review of Systems For ROS see HPI     Objective:   Physical Exam Vitals signs reviewed.  Constitutional:      General: He is not in acute distress.    Appearance: Normal appearance. He is well-developed.  HENT:     Head: Normocephalic and atraumatic.  Eyes:     Conjunctiva/sclera: Conjunctivae normal.     Pupils: Pupils are equal, round, and reactive to light.  Neck:     Musculoskeletal: Normal range of motion and neck supple.     Thyroid: No thyromegaly.  Cardiovascular:     Rate and Rhythm: Normal rate and regular rhythm.     Heart sounds: Normal heart sounds. No murmur.  Pulmonary:     Effort: Pulmonary effort is normal. No respiratory distress.     Breath sounds: Normal breath sounds.  Abdominal:     General: Bowel sounds are normal. There is no distension.     Palpations: Abdomen is soft.  Lymphadenopathy:     Cervical: No cervical adenopathy.  Skin:    General: Skin is warm and dry.  Neurological:     Mental Status: He is alert and oriented to person, place, and time.     Cranial Nerves: No cranial nerve deficit.  Psychiatric:        Behavior: Behavior normal.           Assessment & Plan:  Vasovagal syncope- pt had  episode at ID while getting blood draw.  This is not the first time this has happened but this was the most severe.  Thankfully the Code STEMI was cancelled once it was discovered that his EKG changes were present on past EKGs.  Troponins were (-) x2.  ID and ED notes and labs reviewed.  Pt reports feeling well since his episode.  Is aware that he needs to drink more fluids and eat more regularly.  Will continue to monitor this.  No labs today.

## 2019-01-30 NOTE — Patient Instructions (Addendum)
Follow up as scheduled for your physical- sooner if needed Continue to drink plenty of water and Gatorade Make sure you are eating regularly to avoid your sugar dropping and getting dizzy Complete the Cologuard and return as directed Call with any questions or concerns Stay Safe!  Stay Healthy!

## 2019-01-31 LAB — HIV-1 RNA QUANT-NO REFLEX-BLD
HIV 1 RNA Quant: 63 copies/mL — ABNORMAL HIGH
HIV-1 RNA Quant, Log: 1.8 Log copies/mL — ABNORMAL HIGH

## 2019-04-07 ENCOUNTER — Other Ambulatory Visit: Payer: Self-pay | Admitting: Internal Medicine

## 2019-04-25 ENCOUNTER — Encounter: Payer: Self-pay | Admitting: Family Medicine

## 2019-05-09 ENCOUNTER — Other Ambulatory Visit: Payer: Self-pay | Admitting: Internal Medicine

## 2019-05-09 DIAGNOSIS — B20 Human immunodeficiency virus [HIV] disease: Secondary | ICD-10-CM

## 2019-05-12 ENCOUNTER — Encounter: Payer: Self-pay | Admitting: Family Medicine

## 2019-05-24 ENCOUNTER — Ambulatory Visit: Payer: BC Managed Care – PPO | Attending: Internal Medicine

## 2019-05-24 DIAGNOSIS — Z23 Encounter for immunization: Secondary | ICD-10-CM

## 2019-05-24 NOTE — Progress Notes (Signed)
   Covid-19 Vaccination Clinic  Name:  Hunter Graves    MRN: [id number] DOB: 31-Aug-1965  05/24/2019  [name] was observed post Covid-19 immunization for 15 minutes without incidence. He was provided with Vaccine Information Sheet and instruction to access the V-Safe system.   [name] was instructed to call 911 with any severe reactions post vaccine: Marland Kitchen Difficulty breathing  . Swelling of your face and throat  . A fast heartbeat  . A bad rash all over your body  . Dizziness and weakness    Immunizations Administered    Name Date Dose VIS Date Route   Pfizer COVID-19 Vaccine 05/24/2019  3:25 PM 0.3 mL 03/07/2019 Intramuscular   Manufacturer: [place]   Lot: [id number]   [place]: [id number]

## 2019-05-28 ENCOUNTER — Encounter: Payer: Self-pay | Admitting: Family Medicine

## 2019-05-28 ENCOUNTER — Other Ambulatory Visit: Payer: Self-pay

## 2019-05-28 ENCOUNTER — Ambulatory Visit (INDEPENDENT_AMBULATORY_CARE_PROVIDER_SITE_OTHER): Payer: BC Managed Care – PPO | Admitting: Family Medicine

## 2019-05-28 VITALS — BP 121/83 | HR 76 | Temp 97.9°F | Resp 16 | Ht 70.0 in | Wt 169.4 lb

## 2019-05-28 DIAGNOSIS — E038 Other specified hypothyroidism: Secondary | ICD-10-CM | POA: Diagnosis not present

## 2019-05-28 DIAGNOSIS — B20 Human immunodeficiency virus [HIV] disease: Secondary | ICD-10-CM

## 2019-05-28 DIAGNOSIS — Z0000 Encounter for general adult medical examination without abnormal findings: Secondary | ICD-10-CM

## 2019-05-28 DIAGNOSIS — Z125 Encounter for screening for malignant neoplasm of prostate: Secondary | ICD-10-CM

## 2019-05-28 DIAGNOSIS — Z1211 Encounter for screening for malignant neoplasm of colon: Secondary | ICD-10-CM

## 2019-05-28 LAB — HEPATIC FUNCTION PANEL
ALT: 22 U/L (ref 0–53)
AST: 22 U/L (ref 0–37)
Albumin: 4.3 g/dL (ref 3.5–5.2)
Alkaline Phosphatase: 70 U/L (ref 39–117)
Bilirubin, Direct: 0.2 mg/dL (ref 0.0–0.3)
Total Bilirubin: 0.9 mg/dL (ref 0.2–1.2)
Total Protein: 8.4 g/dL — ABNORMAL HIGH (ref 6.0–8.3)

## 2019-05-28 LAB — CBC WITH DIFFERENTIAL/PLATELET
Basophils Absolute: 0 10*3/uL (ref 0.0–0.1)
Basophils Relative: 0.3 % (ref 0.0–3.0)
Eosinophils Absolute: 0.2 10*3/uL (ref 0.0–0.7)
Eosinophils Relative: 3.8 % (ref 0.0–5.0)
HCT: 46.6 % (ref 39.0–52.0)
Hemoglobin: 15.6 g/dL (ref 13.0–17.0)
Lymphocytes Relative: 27.2 % (ref 12.0–46.0)
Lymphs Abs: 1.6 10*3/uL (ref 0.7–4.0)
MCHC: 33.5 g/dL (ref 30.0–36.0)
MCV: 88.8 fl (ref 78.0–100.0)
Monocytes Absolute: 0.5 10*3/uL (ref 0.1–1.0)
Monocytes Relative: 9.1 % (ref 3.0–12.0)
Neutro Abs: 3.6 10*3/uL (ref 1.4–7.7)
Neutrophils Relative %: 59.6 % (ref 43.0–77.0)
Platelets: 139 10*3/uL — ABNORMAL LOW (ref 150.0–400.0)
RBC: 5.26 Mil/uL (ref 4.22–5.81)
RDW: 13.5 % (ref 11.5–15.5)
WBC: 6 10*3/uL (ref 4.0–10.5)

## 2019-05-28 LAB — BASIC METABOLIC PANEL
BUN: 16 mg/dL (ref 6–23)
CO2: 28 mEq/L (ref 19–32)
Calcium: 9.9 mg/dL (ref 8.4–10.5)
Chloride: 101 mEq/L (ref 96–112)
Creatinine, Ser: 1.64 mg/dL — ABNORMAL HIGH (ref 0.40–1.50)
GFR: 53.54 mL/min — ABNORMAL LOW (ref >60.00)
Glucose, Bld: 98 mg/dL (ref 70–99)
Potassium: 4 mEq/L (ref 3.5–5.1)
Sodium: 137 mEq/L (ref 135–145)

## 2019-05-28 LAB — LIPID PANEL
Cholesterol: 159 mg/dL (ref 0–200)
HDL: 41.5 mg/dL (ref >39.00)
LDL Cholesterol: 96 mg/dL (ref 0–99)
NonHDL: 117.1
Total CHOL/HDL Ratio: 4
Triglycerides: 106 mg/dL (ref 0.0–149.0)
VLDL: 21.2 mg/dL (ref 0.0–40.0)

## 2019-05-28 LAB — TSH: TSH: 0.78 u[IU]/mL (ref 0.35–4.50)

## 2019-05-28 LAB — PSA: PSA: 0.66 ng/mL (ref 0.10–4.00)

## 2019-05-28 NOTE — Assessment & Plan Note (Signed)
Chronic problem, following w/ Dr Baxter Flattery.

## 2019-05-28 NOTE — Assessment & Plan Note (Signed)
Pt's PE WNL.  UTD on immunizations.  Due for colonoscopy- referral placed.  Check labs.  Anticipatory guidance provided.

## 2019-05-28 NOTE — Progress Notes (Signed)
   Subjective:    Patient ID: Hunter Graves, male    DOB: 1965/05/10, 54 y.o.   MRN: [id number]  HPI CPE- UTD on Tdap, flu, pneumonia vaccines.  Has had 1st COVID vaccine.  Due for colonoscopy- got cancelled due to [place] Patient reports no vision/hearing changes, anorexia, fever ,adenopathy, persistant/recurrent hoarseness, swallowing issues, chest pain, palpitations, edema, persistant/recurrent cough, hemoptysis, dyspnea (rest,exertional, paroxysmal nocturnal), gastrointestinal  bleeding (melena, rectal bleeding), abdominal pain, excessive heart burn, GU symptoms (dysuria, hematuria, voiding/incontinence issues) syncope, focal weakness, memory loss, numbness & tingling, skin/hair/nail changes, depression, anxiety, abnormal bruising/bleeding, musculoskeletal symptoms/signs.   This visit occurred during the SARS-CoV-2 public health emergency.  Safety protocols were in place, including screening questions prior to the visit, additional usage of staff PPE, and extensive cleaning of exam room while observing appropriate contact time as indicated for disinfecting solutions.       Objective:   Physical Exam General Appearance:    Alert, cooperative, no distress, appears stated age  Head:    Normocephalic, without obvious abnormality, atraumatic  Eyes:    PERRL, conjunctiva/corneas clear, EOM's intact, fundi    benign, both eyes       Ears:    Normal TM's and external ear canals, both ears  Nose:   Deferred due to COVID  Throat:   Neck:   Supple, symmetrical, trachea midline, no adenopathy;       thyroid:  No enlargement/tenderness/nodules  Back:     Symmetric, no curvature, ROM normal, no CVA tenderness  Lungs:     Clear to auscultation bilaterally, respirations unlabored  Chest wall:    No tenderness or deformity  Heart:    Regular rate and rhythm, S1 and S2 normal, no murmur, rub   or gallop  Abdomen:     Soft, non-tender, bowel sounds active all four quadrants,    no  masses, no organomegaly  Genitalia:    Deferred at pt's request  Rectal:    Extremities:   Extremities normal, atraumatic, no cyanosis or edema  Pulses:   2+ and symmetric all extremities  Skin:   Skin color, texture, turgor normal, no rashes or lesions  Lymph nodes:   Cervical, supraclavicular, and axillary nodes normal  Neurologic:   CNII-XII intact. Normal strength, sensation and reflexes      throughout          Assessment & Plan:

## 2019-05-28 NOTE — Assessment & Plan Note (Signed)
Chronic problem.  Currently asymptomatic.  Check labs.  Adjust meds prn  

## 2019-05-28 NOTE — Patient Instructions (Signed)
Follow up in 1 year or as needed We'll notify you of your lab results and make any changes if needed Keep up the good work!  You look great! We'll call you with your GI appt for the colonoscopy appt Call with any questions or concerns Stay Safe!  Stay Healthy!

## 2019-05-29 ENCOUNTER — Encounter: Payer: Self-pay | Admitting: General Practice

## 2019-06-02 IMAGING — US US THYROID
1 series · 14 of 25 positions shown · non-contrast
Comparison: None.

CLINICAL DATA: Goiter.

EXAM:
THYROID ULTRASOUND
TECHNIQUE: Ultrasound examination of the thyroid gland and adjacent soft
tissues was performed.

[Series 1: us thyroid · 0.06mm/px · 14 of 39 slices shown]
[im 1/39]
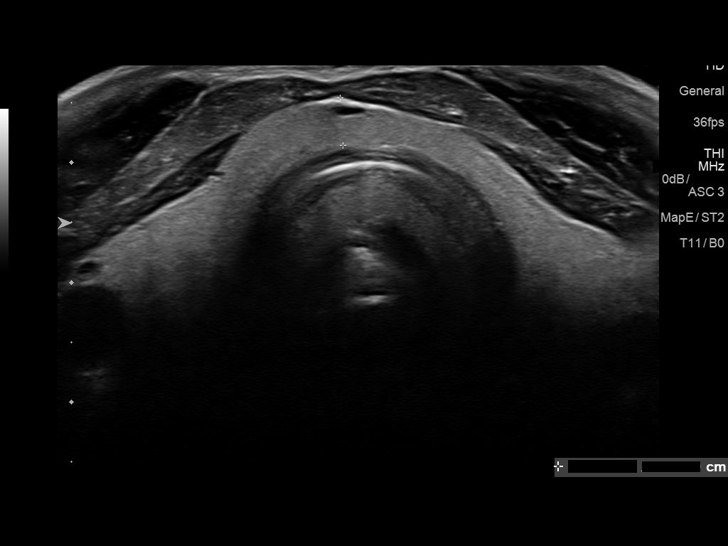
[im 4/39]
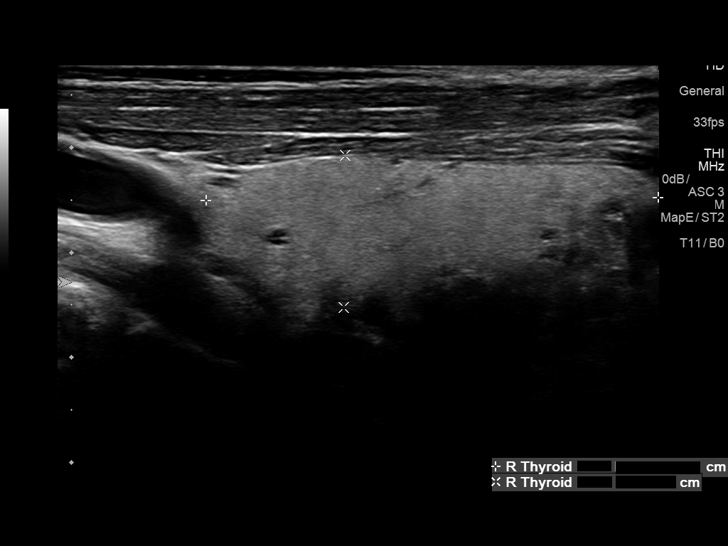
[im 7/39]
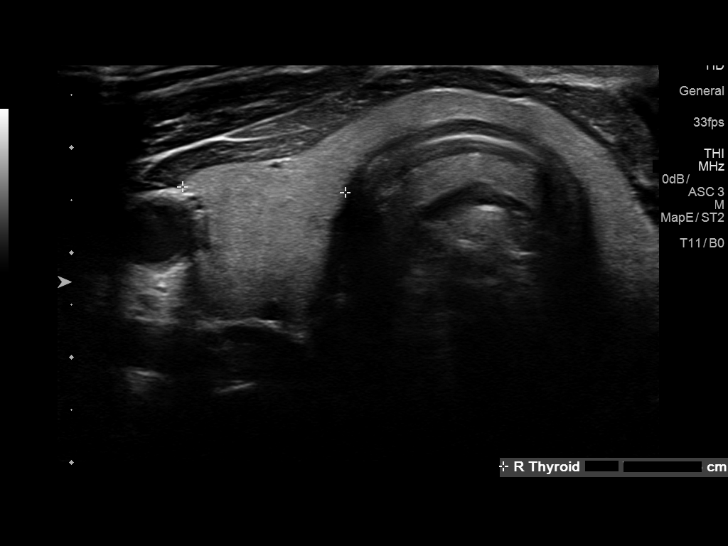
[im 10/39]
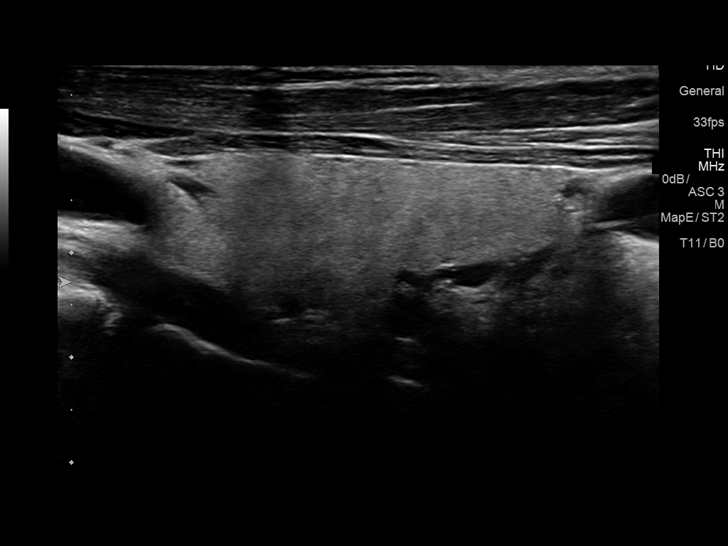
[im 13/39]
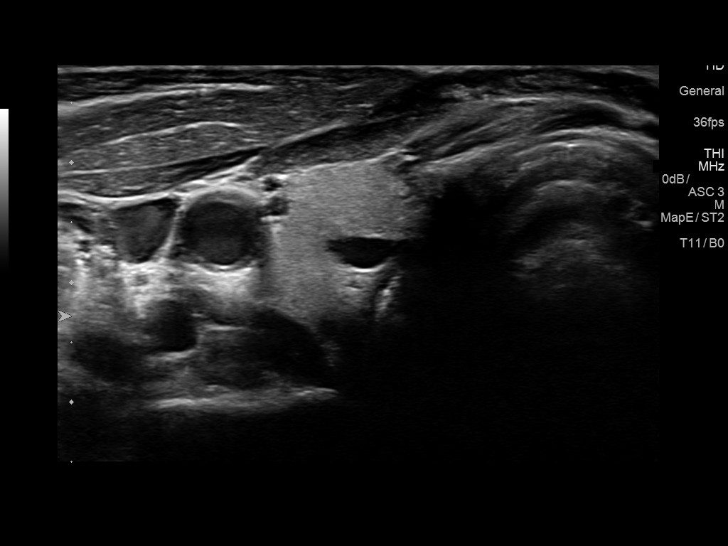
[im 15/39]
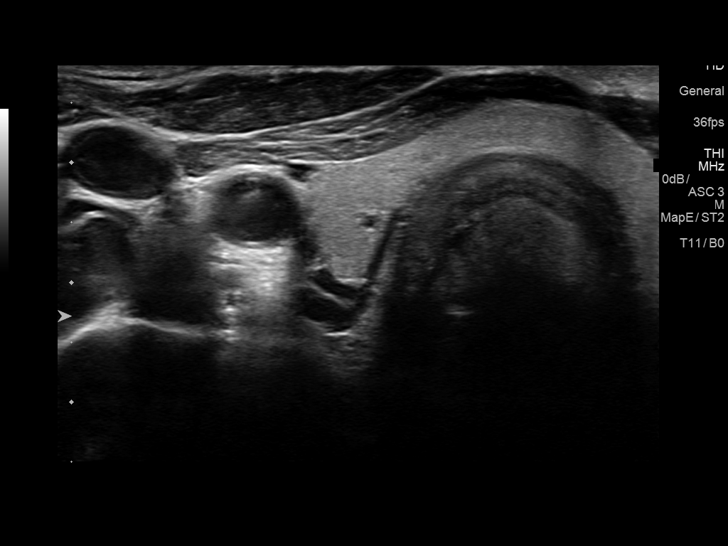
[im 18/39]
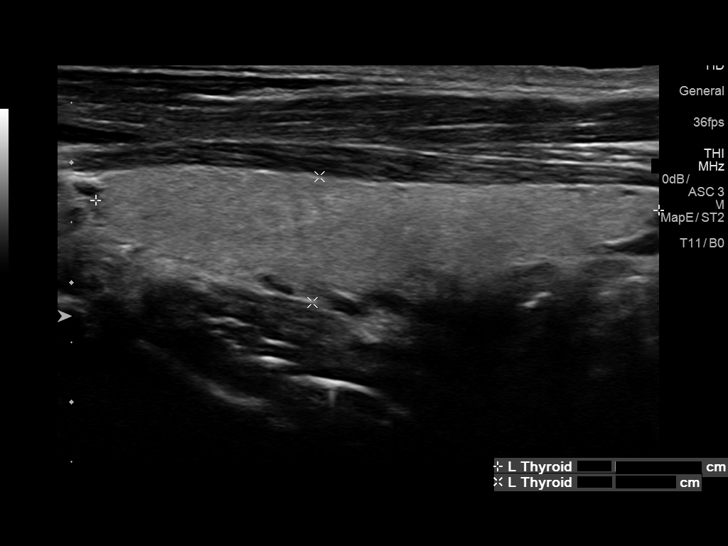
[im 21/39]
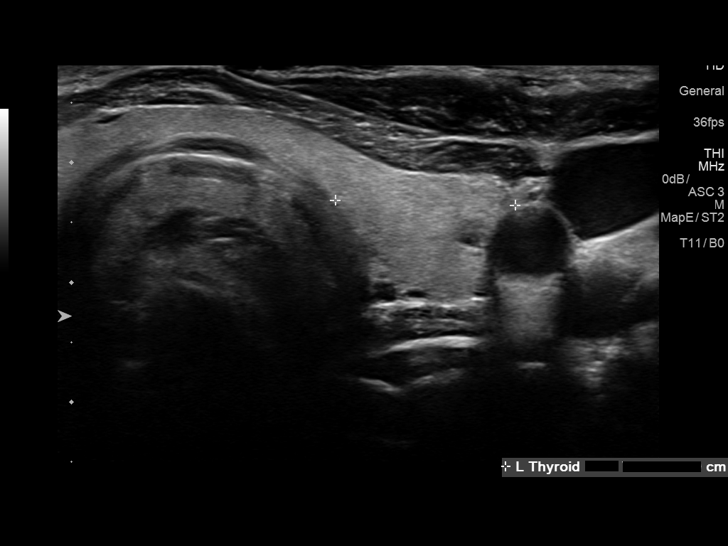
[im 24/39]
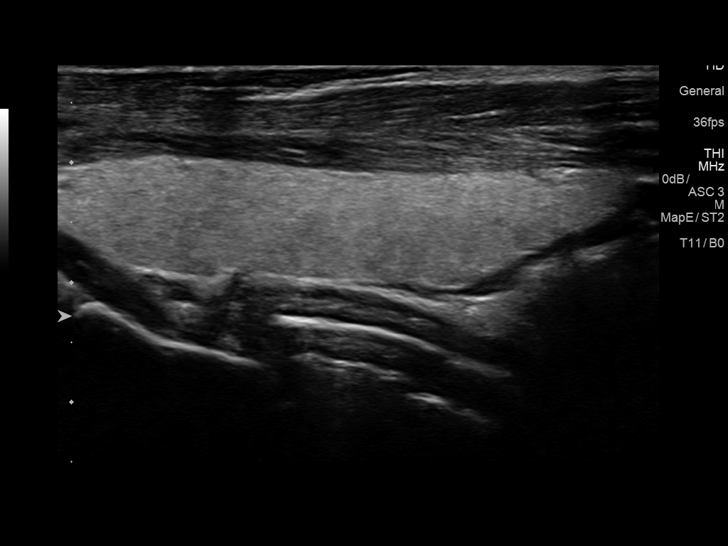
[im 26/39]
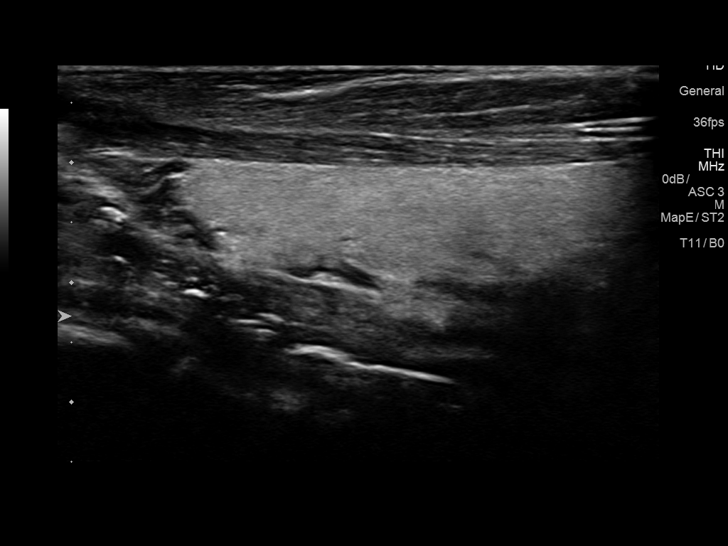
[im 29/39]
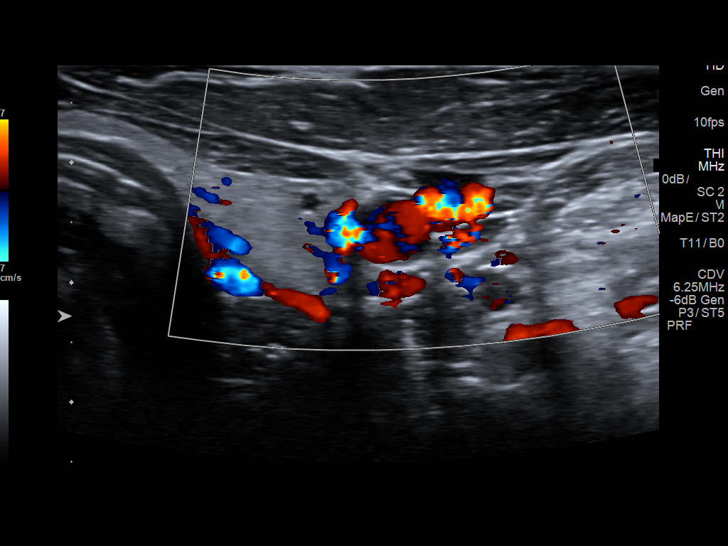
[im 32/39]
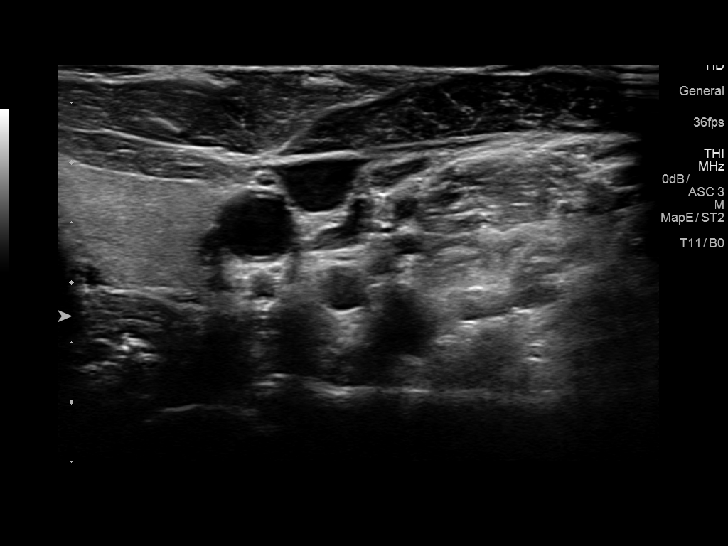
[im 35/39]
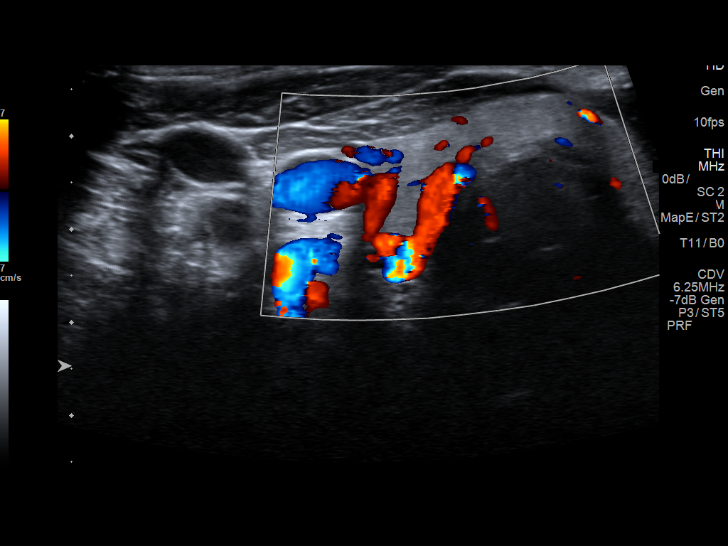
[im 39/39]
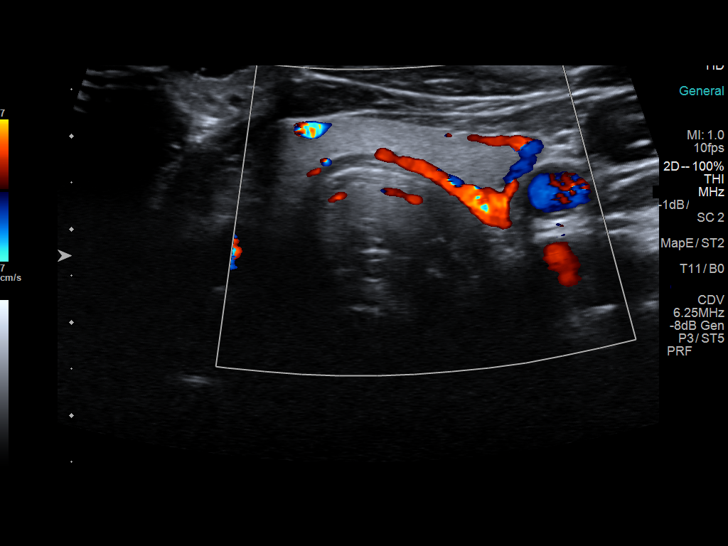

[14 of 25 positions shown; findings below may reference images not displayed]

FINDINGS: Parenchymal Echotexture: Normal

Isthmus: 0.4 cm

Right lobe: 4.3 x 1.5 x 1.6 cm

Left lobe: 4.7 x 1.1 x 1.5 cm

_________________________________________________________

Estimated total number of nodules >/= 1 cm: 0

Number of spongiform nodules >/=  2 cm not described below (TR1): 0

Number of mixed cystic and solid nodules >/= 1.5 cm not described
below (TR2): 0

_________________________________________________________

No discrete nodules are seen within the thyroid gland.
IMPRESSION: Normal thyroid ultrasound.

The above is in keeping with the ACR TI-RADS recommendations - [HOSPITAL] 7792;[DATE].

## 2019-06-14 ENCOUNTER — Ambulatory Visit: Payer: BC Managed Care – PPO | Attending: Internal Medicine

## 2019-06-14 DIAGNOSIS — Z23 Encounter for immunization: Secondary | ICD-10-CM

## 2019-06-14 NOTE — Progress Notes (Signed)
   Covid-19 Vaccination Clinic  Name:  [name]    MRN: [id number] DOB: [birth year]  06/14/2019  [name] was observed post Covid-19 immunization for 15 minutes without incident. He was provided with Vaccine Information Sheet and instruction to access the V-Safe system.   [name] was instructed to call 911 with any severe reactions post vaccine: Marland Kitchen Difficulty breathing  . Swelling of face and throat  . A fast heartbeat  . A bad rash all over body  . Dizziness and weakness   Immunizations Administered    Name Date Dose VIS Date Route   Pfizer COVID-19 Vaccine 06/14/2019  8:39 AM 0.3 mL 03/07/2019 Intramuscular   Manufacturer: [place]   Lot: [id number]   [place]: [id number]

## 2019-06-19 IMAGING — US US ABDOMEN COMPLETE
1 series · 13 of 25 positions shown · non-contrast
Comparison: None.

CLINICAL DATA: Thrombocytopenia.  Evaluate liver and spleen.

EXAM:
ABDOMEN ULTRASOUND COMPLETE

[Series 1: us abdomen complete · 0.19mm/px · 13 of 99 slices shown]
[im 1/99]
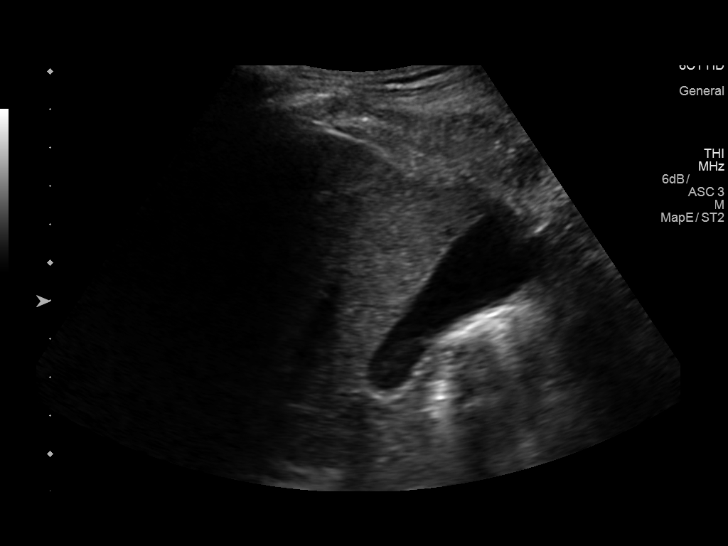
[im 9/99]
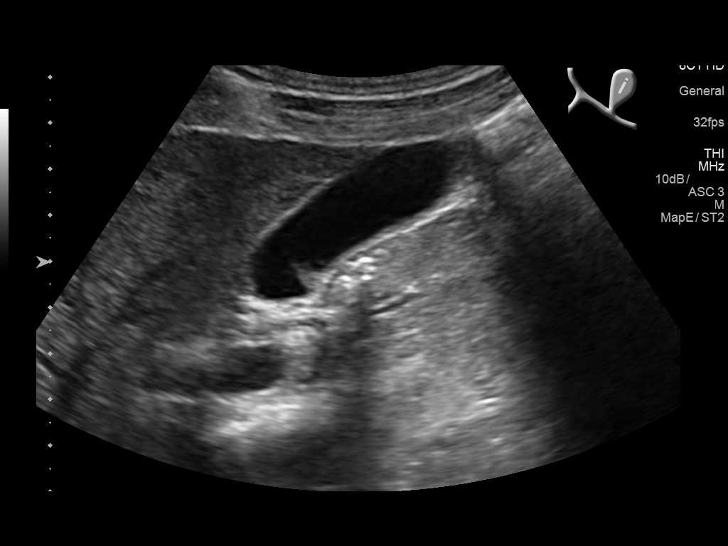
[im 17/99]
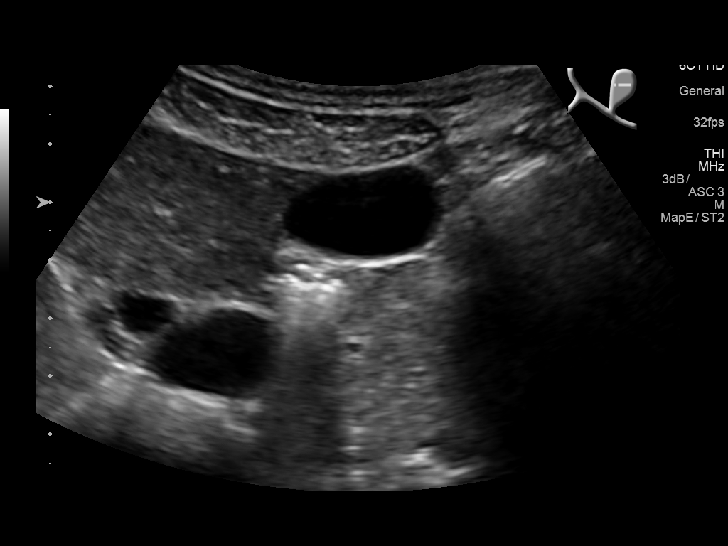
[im 25/99]
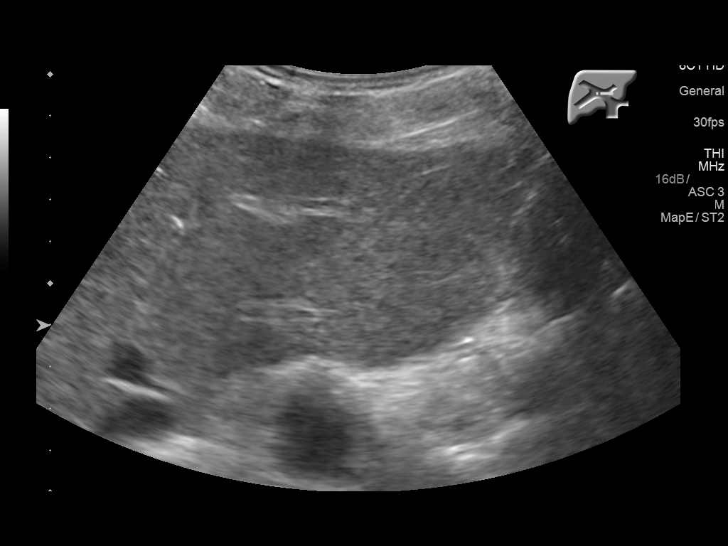
[im 33/99]
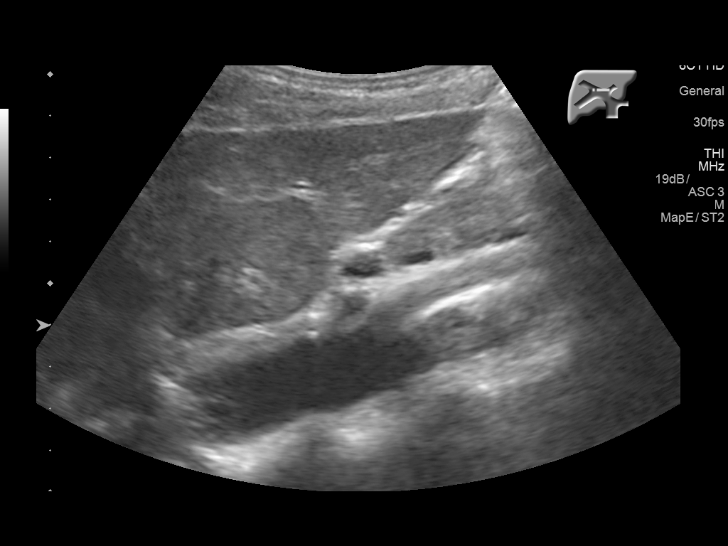
[im 41/99]
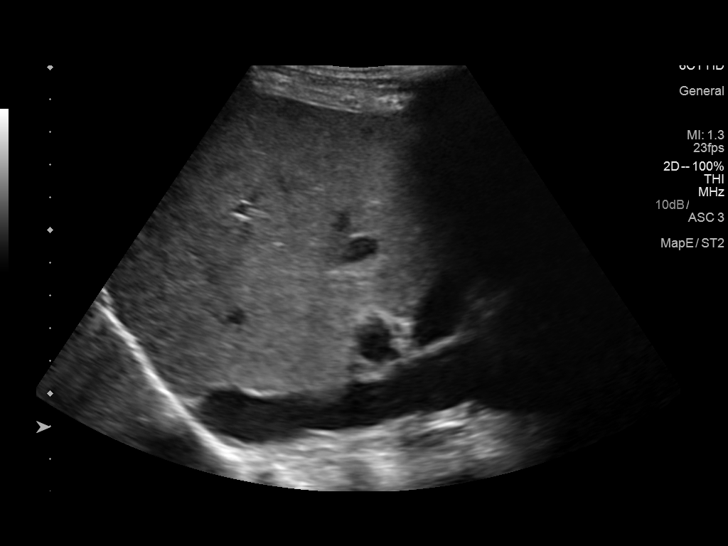
[im 50/99]
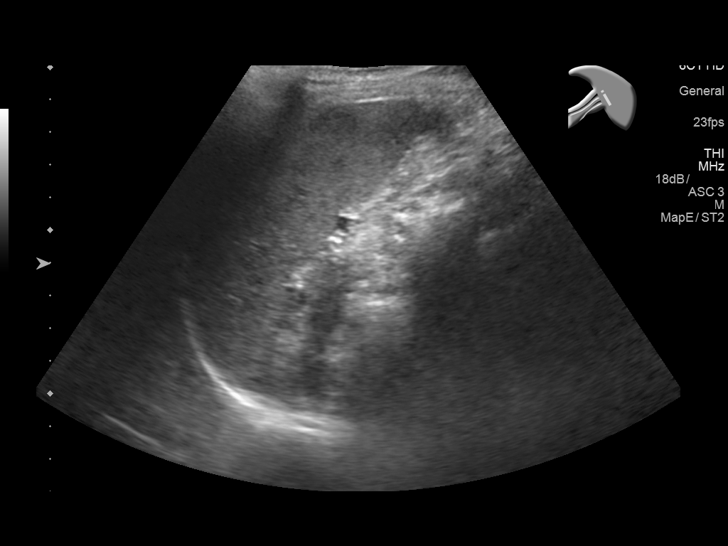
[im 58/99]
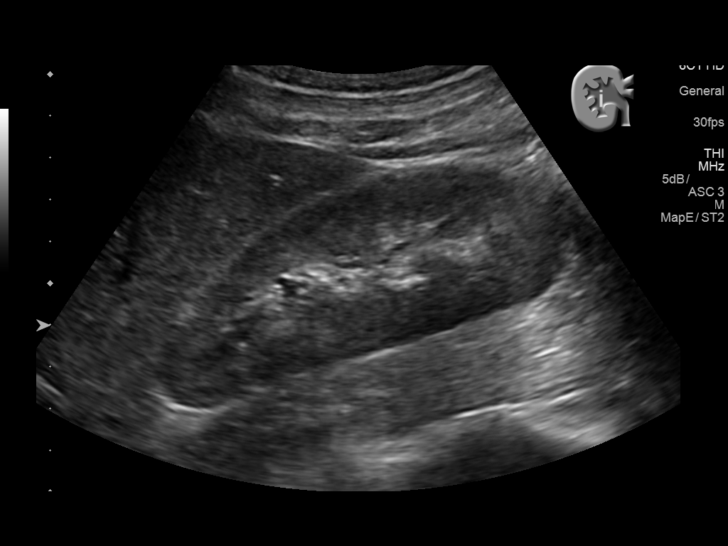
[im 66/99]
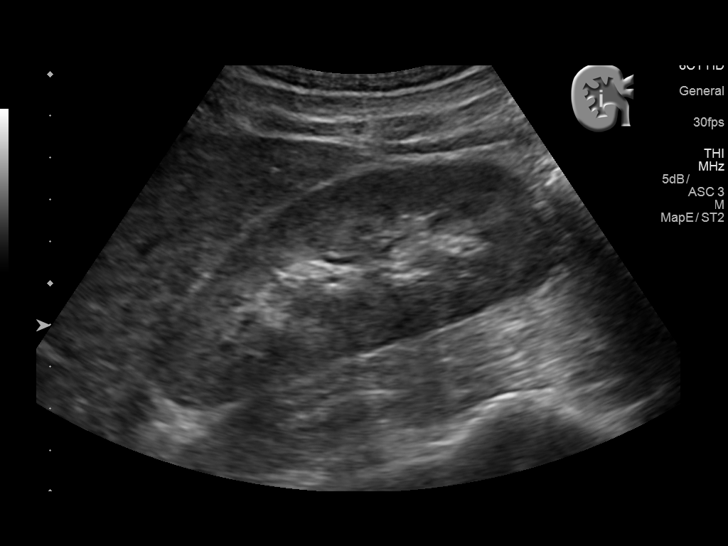
[im 74/99]
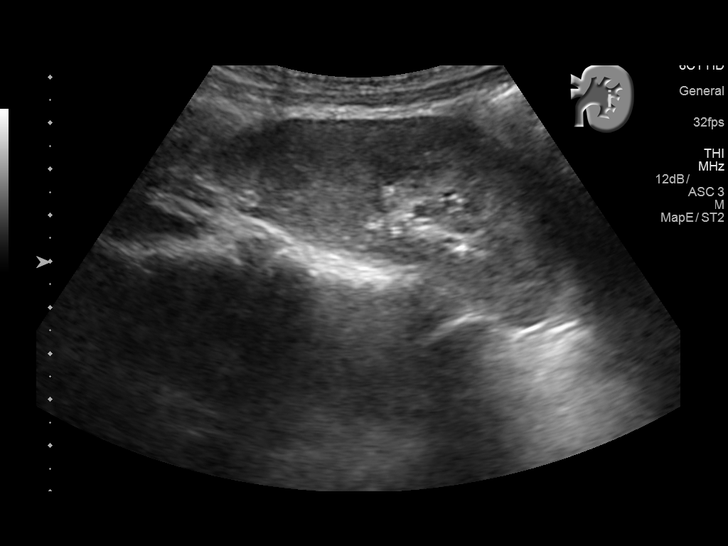
[im 82/99]
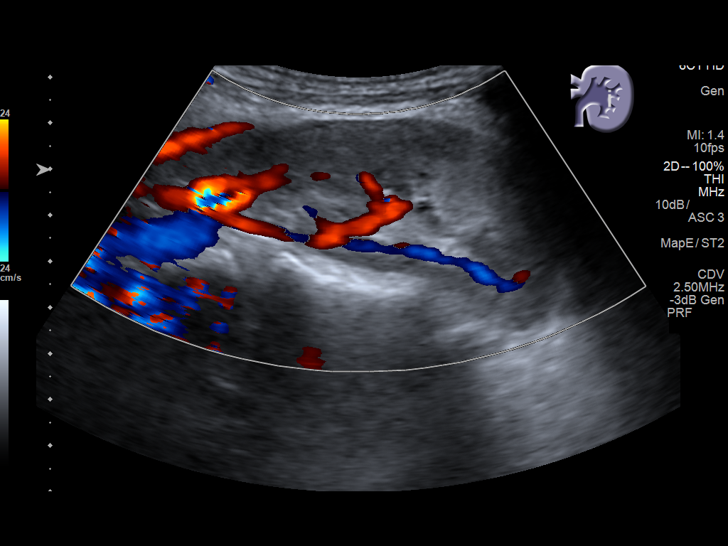
[im 90/99]
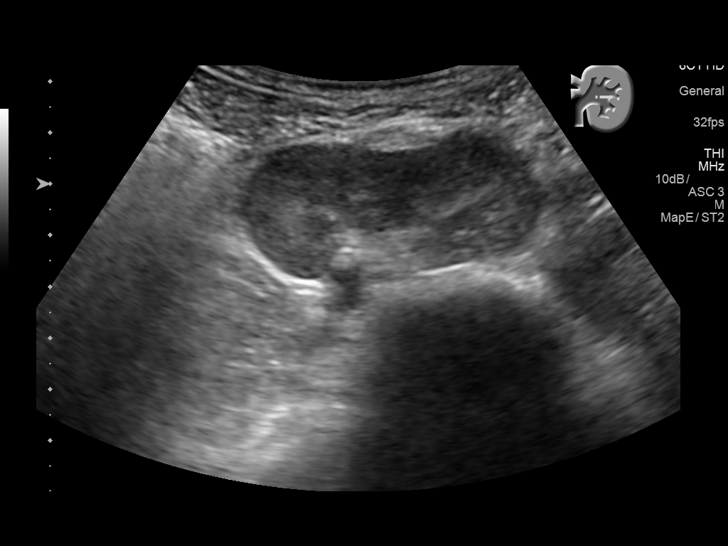
[im 99/99]
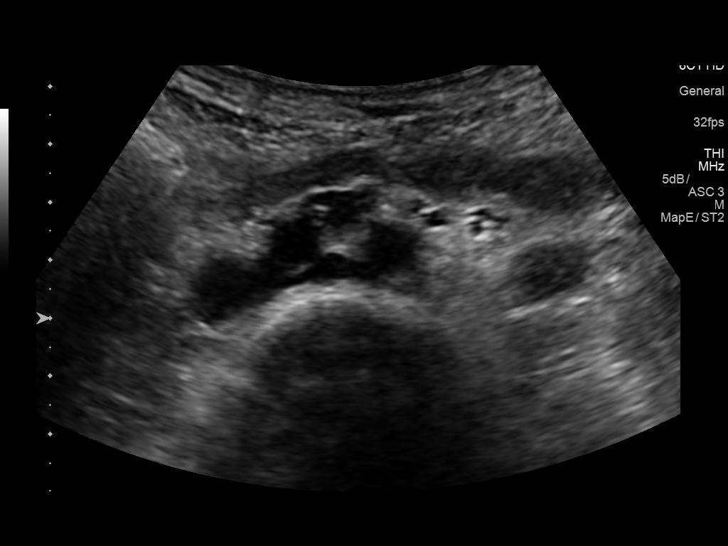

[13 of 25 positions shown; findings below may reference images not displayed]

FINDINGS: Gallbladder: No gallstones or wall thickening visualized. No
sonographic Murphy sign noted by sonographer.

Common bile duct: Diameter: 4 mm.

Liver: No focal lesion identified. Within normal limits in
parenchymal echogenicity. Portal vein is patent on color Doppler
imaging with normal direction of blood flow towards the liver.

IVC: No abnormality visualized.

Pancreas: Visualized portion unremarkable.

Spleen: Size and appearance within normal limits. Small splenule is
noted.

Right Kidney: Length: 10.5 cm. Echogenicity within normal limits. No
mass or hydronephrosis visualized.

Left Kidney: Length: 10.1 cm. Normal cortical echogenicity. No
hydronephrosis or mass. The left kidney is low in position at the
upper pelvis. Eight duplicated collecting system is suspected.

Abdominal aorta: No aneurysm visualized.

Other findings: None.
IMPRESSION: The left kidney is pelvic in location. The left renal collecting
system may be duplicated. The right kidney is within normal limits.
There is no hydronephrosis.

Liver and spleen are within normal limits.

## 2019-07-01 ENCOUNTER — Other Ambulatory Visit: Payer: Self-pay | Admitting: Internal Medicine

## 2019-09-12 ENCOUNTER — Other Ambulatory Visit: Payer: Self-pay | Admitting: Family Medicine

## 2019-10-23 ENCOUNTER — Other Ambulatory Visit: Payer: Self-pay | Admitting: Internal Medicine

## 2019-10-23 DIAGNOSIS — B20 Human immunodeficiency virus [HIV] disease: Secondary | ICD-10-CM

## 2020-04-15 ENCOUNTER — Other Ambulatory Visit: Payer: Self-pay | Admitting: Internal Medicine

## 2020-04-15 DIAGNOSIS — B20 Human immunodeficiency virus [HIV] disease: Secondary | ICD-10-CM

## 2020-04-26 ENCOUNTER — Other Ambulatory Visit: Payer: Self-pay

## 2020-04-26 DIAGNOSIS — B20 Human immunodeficiency virus [HIV] disease: Secondary | ICD-10-CM

## 2020-04-26 DIAGNOSIS — Z1211 Encounter for screening for malignant neoplasm of colon: Secondary | ICD-10-CM

## 2020-04-26 DIAGNOSIS — Z113 Encounter for screening for infections with a predominantly sexual mode of transmission: Secondary | ICD-10-CM

## 2020-04-26 DIAGNOSIS — Z79899 Other long term (current) drug therapy: Secondary | ICD-10-CM

## 2020-04-29 ENCOUNTER — Other Ambulatory Visit: Payer: BC Managed Care – PPO

## 2020-04-29 ENCOUNTER — Other Ambulatory Visit (HOSPITAL_COMMUNITY)
Admission: RE | Admit: 2020-04-29 | Discharge: 2020-04-29 | Disposition: A | Discharge status: Home / Self Care | Payer: BC Managed Care – PPO | Source: Ambulatory Visit | Attending: Internal Medicine | Admitting: Internal Medicine

## 2020-04-29 ENCOUNTER — Other Ambulatory Visit: Payer: Self-pay

## 2020-04-29 DIAGNOSIS — B20 Human immunodeficiency virus [HIV] disease: Secondary | ICD-10-CM | POA: Diagnosis not present

## 2020-04-29 DIAGNOSIS — Z79899 Other long term (current) drug therapy: Secondary | ICD-10-CM

## 2020-04-29 DIAGNOSIS — Z113 Encounter for screening for infections with a predominantly sexual mode of transmission: Secondary | ICD-10-CM | POA: Insufficient documentation

## 2020-04-30 ENCOUNTER — Other Ambulatory Visit: Payer: Self-pay | Admitting: Internal Medicine

## 2020-04-30 DIAGNOSIS — B20 Human immunodeficiency virus [HIV] disease: Secondary | ICD-10-CM

## 2020-04-30 LAB — URINE CYTOLOGY ANCILLARY ONLY
Chlamydia: NEGATIVE
Comment: NEGATIVE
Comment: NORMAL
Neisseria Gonorrhea: NEGATIVE

## 2020-04-30 LAB — T-HELPER CELL (CD4) - (RCID CLINIC ONLY)
CD4 % Helper T Cell: 17 % — ABNORMAL LOW (ref 33–65)
CD4 T Cell Abs: 284 /uL — ABNORMAL LOW (ref 400–1790)

## 2020-05-03 LAB — LIPID PANEL
Cholesterol: 148 mg/dL (ref <200)
HDL: 42 mg/dL (ref >=40)
LDL Cholesterol (Calc): 83 mg/dL (calc)
Non-HDL Cholesterol (Calc): 106 mg/dL (calc) (ref <130)
Total CHOL/HDL Ratio: 3.5 (calc) (ref <5.0)
Triglycerides: 135 mg/dL (ref <150)

## 2020-05-03 LAB — CBC WITH DIFFERENTIAL/PLATELET
Absolute Monocytes: 499 cells/uL (ref 200–950)
Basophils Absolute: 32 cells/uL (ref 0–200)
Basophils Relative: 0.5 %
Eosinophils Absolute: 147 cells/uL (ref 15–500)
Eosinophils Relative: 2.3 %
HCT: 48.1 % (ref 38.5–50.0)
Hemoglobin: 16.5 g/dL (ref 13.2–17.1)
Lymphs Abs: 1741 cells/uL (ref 850–3900)
MCH: 30.2 pg (ref 27.0–33.0)
MCHC: 34.3 g/dL (ref 32.0–36.0)
MCV: 87.9 fL (ref 80.0–100.0)
MPV: 12.4 fL (ref 7.5–12.5)
Monocytes Relative: 7.8 %
Neutro Abs: 3981 cells/uL (ref 1500–7800)
Neutrophils Relative %: 62.2 %
Platelets: 177 10*3/uL (ref 140–400)
RBC: 5.47 10*6/uL (ref 4.20–5.80)
RDW: 12.6 % (ref 11.0–15.0)
Total Lymphocyte: 27.2 %
WBC: 6.4 10*3/uL (ref 3.8–10.8)

## 2020-05-03 LAB — COMPLETE METABOLIC PANEL WITH GFR
AG Ratio: 1.2 (calc) (ref 1.0–2.5)
ALT: 13 U/L (ref 9–46)
AST: 17 U/L (ref 10–35)
Albumin: 4.3 g/dL (ref 3.6–5.1)
Alkaline phosphatase (APISO): 69 U/L (ref 35–144)
BUN/Creatinine Ratio: 9 (calc) (ref 6–22)
BUN: 16 mg/dL (ref 7–25)
CO2: 24 mmol/L (ref 20–32)
Calcium: 9.4 mg/dL (ref 8.6–10.3)
Chloride: 104 mmol/L (ref 98–110)
Creat: 1.82 mg/dL — ABNORMAL HIGH (ref 0.70–1.33)
GFR, Est African American: 48 mL/min/{1.73_m2} — ABNORMAL LOW (ref >=60)
GFR, Est Non African American: 41 mL/min/{1.73_m2} — ABNORMAL LOW (ref >=60)
Globulin: 3.7 g/dL (calc) (ref 1.9–3.7)
Glucose, Bld: 89 mg/dL (ref 65–99)
Potassium: 4.1 mmol/L (ref 3.5–5.3)
Sodium: 139 mmol/L (ref 135–146)
Total Bilirubin: 0.4 mg/dL (ref 0.2–1.2)
Total Protein: 8 g/dL (ref 6.1–8.1)

## 2020-05-03 LAB — RPR: RPR Ser Ql: NONREACTIVE

## 2020-05-03 LAB — HIV-1 RNA QUANT-NO REFLEX-BLD
HIV 1 RNA Quant: <20 Copies/mL — ABNORMAL HIGH
HIV-1 RNA Quant, Log: <1.3 Log cps/mL — ABNORMAL HIGH

## 2020-05-05 ENCOUNTER — Encounter: Payer: Self-pay | Admitting: Gastroenterology

## 2020-05-11 ENCOUNTER — Encounter: Payer: Self-pay | Admitting: Internal Medicine

## 2020-05-11 ENCOUNTER — Ambulatory Visit: Payer: BC Managed Care – PPO | Admitting: Internal Medicine

## 2020-05-11 ENCOUNTER — Other Ambulatory Visit: Payer: Self-pay

## 2020-05-11 VITALS — BP 150/91 | HR 78 | Temp 97.9°F | Wt 167.0 lb

## 2020-05-11 DIAGNOSIS — I10 Essential (primary) hypertension: Secondary | ICD-10-CM | POA: Diagnosis not present

## 2020-05-11 DIAGNOSIS — B20 Human immunodeficiency virus [HIV] disease: Secondary | ICD-10-CM | POA: Diagnosis not present

## 2020-05-11 DIAGNOSIS — R7989 Other specified abnormal findings of blood chemistry: Secondary | ICD-10-CM | POA: Diagnosis not present

## 2020-05-11 NOTE — Progress Notes (Signed)
Helped patient make appointment for [place] booster 2/21 at [facility], 11:15.  Landis Gandy, RN

## 2020-05-11 NOTE — Progress Notes (Signed)
[facility] for Infectious Disease   CHIEF COMPLAINT    HIV follow up.  Followed by Dr Baxter Flattery and last seen 01/2019  SUBJECTIVE:    Hunter Graves is a 55 y.o. male with PMHx as below who presents to the clinic for HIV follow up.   He was last seen in Nov 2020. Continued on Biktarvy at that time.  Labs done 04/29/20 with CD4 284 (17%).  HIV RNA <20. Creatinine stable.    He has received 2 doses of [place] with last dose being 06/14/19.    Please see A&P for the details of today's visit and status of the patient's medical problems.   Patient's Medications  New Prescriptions   No medications on file  Previous Medications   BIKTARVY 50-200-25 MG TABS TABLET    TAKE 1 TABLET BY MOUTH 1 TIME A DAY.   [facility] IRON 325 (65 FE) MG TABLET    TAKE 1 TABLET BY MOUTH EVERY DAY WITH BREAKFAST   LEVOTHYROXINE (SYNTHROID) 50 MCG TABLET    TAKE 1 TABLET BY MOUTH EVERY DAY BEFORE BREAKFAST  Modified Medications   No medications on file  Discontinued Medications   No medications on file      Past Medical History:  Diagnosis Date  . Chicken pox   . HIV (human immunodeficiency virus infection) ([place])   . Seizures ([place])    "think I had my 1st today; they are still not sure" (08/21/2017)  . Syncope and collapse    "think I had my 1st today; they are still not sure" (08/21/2017)  . Thyroid disease     Social History   Tobacco Use  . Smoking status: Never Smoker  . Smokeless tobacco: Never Used  Vaping Use  . Vaping Use: Never used  Substance Use Topics  . Alcohol use: Not Currently    Comment: 08/21/2017 "1 drink q 6 months or less"  . Drug use: Not Currently    Family History  Problem Relation Age of Onset  . Arthritis Mother   . Thyroid disease Mother   . Hypertension Mother   . Depression Mother   . Arthritis Maternal Grandmother   . Dementia Maternal Grandmother   . Diabetes Maternal Grandfather   . Hypertension Maternal Grandfather   . Emphysema Maternal  Grandfather   . Cancer Maternal Aunt        breast  . Cancer Maternal Aunt 52       inflammatory breast cancer  . Cancer Maternal Aunt        breast cancer  . Cancer Cousin 40       breast    No Known Allergies  Review of Systems  Constitutional: Negative.   Respiratory: Negative.   Cardiovascular: Negative.   Gastrointestinal: Negative.   Genitourinary: Negative.      OBJECTIVE:    Vitals:   05/11/20 0949  BP: (!) 150/91  Pulse: 78  Temp: 97.9 F (36.6 C)  TempSrc: Oral  SpO2: 100%  Weight: 167 lb (75.8 kg)     Body mass index is 23.96 kg/m.  Physical Exam Constitutional:      General: He is not in acute distress.    Appearance: Normal appearance.  Neurological:     General: No focal deficit present.     Mental Status: He is alert and oriented to person, place, and time.  Psychiatric:        Mood and Affect: Mood normal.  Behavior: Behavior normal.     Labs and Microbiology: CMP Latest Ref Rng & Units 04/29/2020 05/28/2019 01/27/2019  Glucose 65 - 99 mg/dL 89 98 149(H)  BUN 7 - 25 mg/dL 16 16 16   Creatinine 0.70 - 1.33 mg/dL 1.82(H) 1.64(H) 1.82(H)  Sodium 135 - 146 mmol/L 139 137 135  Potassium 3.5 - 5.3 mmol/L 4.1 4.0 3.6  Chloride 98 - 110 mmol/L 104 101 103  CO2 20 - 32 mmol/L 24 28 17(L)  Calcium 8.6 - 10.3 mg/dL 9.4 9.9 9.3  Total Protein 6.1 - 8.1 g/dL 8.0 8.4(H) 8.1  Total Bilirubin 0.2 - 1.2 mg/dL 0.4 0.9 1.4(H)  Alkaline Phos 39 - 117 U/L - 70 56  AST 10 - 35 U/L 17 22 25   ALT 9 - 46 U/L 13 22 19    CBC Latest Ref Rng & Units 04/29/2020 05/28/2019 01/27/2019  WBC 3.8 - 10.8 Thousand/uL 6.4 6.0 15.8(H)  Hemoglobin 13.2 - 17.1 g/dL 16.5 15.6 12.6(L)  Hematocrit 38.5 - 50.0 % 48.1 46.6 39.9  Platelets 140 - 400 Thousand/uL 177 139.0(L) 177     Lab Results  Component Value Date   HIV1RNAQUANT <20 (H) 04/29/2020   HIV1RNAQUANT 63 (H) 01/27/2019   HIV1RNAQUANT 26 (H) 07/04/2018   CD4TABS 284 (L) 04/29/2020   CD4TABS 248 (L) 01/27/2019    CD4TABS 240 (L) 07/04/2018    RPR and STI: Lab Results  Component Value Date   LABRPR NON-REACTIVE 04/29/2020   LABRPR NON-REACTIVE 08/29/2017    STI Results GC CT  04/29/2020 Negative Negative    Hepatitis B: Lab Results  Component Value Date   HEPBSAB Non Reactive 08/24/2017   HEPBSAG Negative 08/24/2017   HEPBCAB Negative 08/24/2017   Hepatitis C: No results found for: HEPCAB, HCVRNAPCRQN Hepatitis A: Lab Results  Component Value Date   HAV NON-REACTIVE 08/29/2017   Lipids: Lab Results  Component Value Date   CHOL 148 04/29/2020   TRIG 135 04/29/2020   HDL 42 04/29/2020   CHOLHDL 3.5 04/29/2020   VLDL 21.2 05/28/2019   LDLCALC 83 04/29/2020       ASSESSMENT & PLAN:    HIV - doing well on biktarvy and will continue  CKD3 - stable on recent labs  Health maintenance - covid booster to be done this Friday  HTN - BP elevated today.  Not currently on meds and advised f/u with PCP  RTC 6 months  Raynelle Highland for Infectious Disease [facility] 05/11/2020, 10:07 AM

## 2020-05-11 NOTE — Patient Instructions (Signed)
Thank you for coming to see me today. It was a pleasure seeing you.  To Do: Marland Kitchen Continue Biktarvy.  Please let us know if you have any issues getting your refill . Follow up with your PCP about blood pressure . Follow up with [place] in 6 months with labs before  If you have any questions or concerns, please do not hesitate to call the office at [phone].  Take Care,   Jule Ser, DO

## 2020-05-17 ENCOUNTER — Ambulatory Visit: Payer: BC Managed Care – PPO | Attending: Internal Medicine

## 2020-05-17 ENCOUNTER — Other Ambulatory Visit (HOSPITAL_COMMUNITY): Payer: Self-pay | Admitting: Internal Medicine

## 2020-05-17 DIAGNOSIS — Z23 Encounter for immunization: Secondary | ICD-10-CM

## 2020-05-17 NOTE — Progress Notes (Signed)
   Covid-19 Vaccination Clinic  Name:  KAYSTON JODOIN    MRN: [id number] DOB: [birth year]  05/17/2020  Mr. Cederberg was observed post Covid-19 immunization for 15 minutes without incident. He was provided with Vaccine Information Sheet and instruction to access the V-Safe system.   Mr. Torrance was instructed to call 911 with any severe reactions post vaccine: Marland Kitchen Difficulty breathing  . Swelling of face and throat  . A fast heartbeat  . A bad rash all over body  . Dizziness and weakness   Immunizations Administered    Name Date Dose VIS Date Route   PFIZER Comrnaty(Gray TOP) Covid-19 Vaccine 05/17/2020 10:36 AM 0.3 mL 03/04/2020 Intramuscular   Manufacturer: [employer]   Lot: [id number]   NDC: [phone]

## 2020-05-18 MED FILL — PFIZER-BIONT COVID-19 VAC-T: 30 | 21 days supply | Qty: 0 | Fill #0

## 2020-06-02 ENCOUNTER — Encounter: Payer: Self-pay | Admitting: Family Medicine

## 2020-06-02 ENCOUNTER — Ambulatory Visit (INDEPENDENT_AMBULATORY_CARE_PROVIDER_SITE_OTHER): Payer: BC Managed Care – PPO | Admitting: Family Medicine

## 2020-06-02 ENCOUNTER — Other Ambulatory Visit: Payer: Self-pay

## 2020-06-02 VITALS — BP 128/80 | HR 82 | Temp 97.6°F | Resp 18 | Ht 70.0 in | Wt 168.6 lb

## 2020-06-02 DIAGNOSIS — Z125 Encounter for screening for malignant neoplasm of prostate: Secondary | ICD-10-CM

## 2020-06-02 DIAGNOSIS — Z0000 Encounter for general adult medical examination without abnormal findings: Secondary | ICD-10-CM

## 2020-06-02 DIAGNOSIS — E038 Other specified hypothyroidism: Secondary | ICD-10-CM | POA: Diagnosis not present

## 2020-06-02 NOTE — Progress Notes (Signed)
   Subjective:    Patient ID: Hunter Graves, male    DOB: [birth year], 55 y.o.   MRN: [id number]  HPI CPE- Due for colonoscopy (scheduled April), UTD on pneumonia vaccines, Tdap, COVID.  'i'm feeling good'  Reviewed past medical, surgical, family and social histories.   Patient Care Team    Relationship Specialty Notifications Start End  Midge Minium, MD PCP - General Family Medicine  08/23/17   Carlyle Basques, MD Consulting Physician Infectious Diseases  06/02/20      Health Maintenance  Topic Date Due  . INFLUENZA VACCINE  06/24/2020 (Originally 10/26/2019)  . COLONOSCOPY (Pts 45-23yrs Insurance coverage will need to be confirmed)  07/12/2020 (Originally 11/17/2011)  . COVID-19 Vaccine (4 - Booster for Pfizer series) 11/14/2020  . TETANUS/TDAP  05/23/2028  . Hepatitis C Screening  Completed  . HIV Screening  Completed  . HPV VACCINES  Aged Out      Review of Systems Patient reports no vision/hearing changes, anorexia, fever ,adenopathy, persistant/recurrent hoarseness, swallowing issues, chest pain, palpitations, edema, persistant/recurrent cough, hemoptysis, dyspnea (rest,exertional, paroxysmal nocturnal), gastrointestinal  bleeding (melena, rectal bleeding), excessive heart burn, GU symptoms (dysuria, hematuria, voiding/incontinence issues) syncope, focal weakness, memory loss, numbness & tingling, skin/hair/nail changes, depression, anxiety, abnormal bruising/bleeding, musculoskeletal symptoms/signs.   + squeezing central abdominal pain that occurs w/ certain positions.  Pain is enough to 'lay me out on the floor'.  Thankfully has not occurred recently.    Objective:   Physical Exam General Appearance:    Alert, cooperative, no distress, appears stated age  Head:    Normocephalic, without obvious abnormality, atraumatic  Eyes:    PERRL, conjunctiva/corneas clear, EOM's intact,       Ears:    Normal TM's and external ear canals, both ears  Nose:   Nares normal, septum  midline, mucosa normal, no drainage   or sinus tenderness  Throat:   Lips, mucosa, and tongue normal; poor dentition, gums normal  Neck:   Supple, symmetrical, trachea midline, no adenopathy;       thyroid:  No enlargement/tenderness/nodules  Back:     Symmetric, no curvature, ROM normal, no CVA tenderness  Lungs:     Clear to auscultation bilaterally, respirations unlabored  Chest wall:    No tenderness or deformity  Heart:    Regular rate and rhythm, S1 and S2 normal, no murmur, rub   or gallop  Abdomen:     Soft, non-tender, bowel sounds active all four quadrants,    no masses, no organomegaly  Genitalia:    Deferred  Rectal:    Extremities:   Extremities normal, atraumatic, no cyanosis or edema  Pulses:   2+ and symmetric all extremities  Skin:   Skin color, texture, turgor normal, no rashes or lesions  Lymph nodes:   Cervical, supraclavicular, and axillary nodes normal  Neurologic:   CNII-XII intact. Normal strength, sensation and reflexes      throughout          Assessment & Plan:

## 2020-06-02 NOTE — Assessment & Plan Note (Signed)
Pt is due for TSH today but he has to go back to work and he has a tendency to pass out w/ blood draws.  Will hold off on labs today and have these drawn at his next ID appt.  Pt expressed understanding and is in agreement w/ plan.

## 2020-06-02 NOTE — Patient Instructions (Signed)
Follow up in 1 year or as needed No need for labs today- YAY!!! Call with any questions or concerns Stay Safe!  Stay Healthy!!

## 2020-06-02 NOTE — Assessment & Plan Note (Signed)
Pt's PE WNL w/ exception of poor dentition and unchanged from previous.  UTD on immunizations.  Colonoscopy scheduled.  Reviewed recent labs done at ID.  Anticipatory guidance provided.

## 2020-06-03 ENCOUNTER — Other Ambulatory Visit: Payer: Self-pay | Admitting: Internal Medicine

## 2020-06-03 DIAGNOSIS — B20 Human immunodeficiency virus [HIV] disease: Secondary | ICD-10-CM

## 2020-06-05 ENCOUNTER — Other Ambulatory Visit: Payer: Self-pay | Admitting: Family Medicine

## 2020-06-28 ENCOUNTER — Ambulatory Visit (AMBULATORY_SURGERY_CENTER): Payer: BC Managed Care – PPO | Admitting: *Deleted

## 2020-06-28 ENCOUNTER — Other Ambulatory Visit: Payer: Self-pay

## 2020-06-28 VITALS — Ht 69.0 in | Wt 169.0 lb

## 2020-06-28 DIAGNOSIS — Z1211 Encounter for screening for malignant neoplasm of colon: Secondary | ICD-10-CM

## 2020-06-28 NOTE — Progress Notes (Signed)

## 2020-07-06 ENCOUNTER — Encounter: Payer: Self-pay | Admitting: Gastroenterology

## 2020-07-12 ENCOUNTER — Other Ambulatory Visit: Payer: Self-pay

## 2020-07-12 ENCOUNTER — Encounter: Payer: Self-pay | Admitting: Gastroenterology

## 2020-07-12 ENCOUNTER — Ambulatory Visit (AMBULATORY_SURGERY_CENTER): Payer: BC Managed Care – PPO | Admitting: Gastroenterology

## 2020-07-12 VITALS — BP 101/53 | HR 87 | Temp 98.6°F | Resp 18 | Ht 70.0 in | Wt 169.0 lb

## 2020-07-12 DIAGNOSIS — D122 Benign neoplasm of ascending colon: Secondary | ICD-10-CM

## 2020-07-12 DIAGNOSIS — Z1211 Encounter for screening for malignant neoplasm of colon: Secondary | ICD-10-CM

## 2020-07-12 MED ORDER — SODIUM CHLORIDE 0.9 % IV SOLN: 500.0000 mL | Freq: Once | INTRAVENOUS | Status: DC

## 2020-07-12 NOTE — Patient Instructions (Signed)
Handout given:  Polyps Resume previous diet Continue current medications Await pathology results  YOU HAD AN ENDOSCOPIC PROCEDURE TODAY AT THE [facility]:   Refer to the procedure report that was given to you for any specific questions about what was found during the examination.  If the procedure report does not answer your questions, please call your gastroenterologist to clarify.  If you requested that your care partner not be given the details of your procedure findings, then the procedure report has been included in a sealed envelope for you to review at your convenience later.  YOU SHOULD EXPECT: Some feelings of bloating in the abdomen. Passage of more gas than usual.  Walking can help get rid of the air that was put into your GI tract during the procedure and reduce the bloating. If you had a lower endoscopy (such as a colonoscopy or flexible sigmoidoscopy) you may notice spotting of blood in your stool or on the toilet paper. If you underwent a bowel prep for your procedure, you may not have a normal bowel movement for a few days.  Please Note:  You might notice some irritation and congestion in your nose or some drainage.  This is from the oxygen used during your procedure.  There is no need for concern and it should clear up in a day or so.  SYMPTOMS TO REPORT IMMEDIATELY:   Following lower endoscopy (colonoscopy or flexible sigmoidoscopy):  Excessive amounts of blood in the stool  Significant tenderness or worsening of abdominal pains  Swelling of the abdomen that is new, acute  Fever of 100F or higher  For urgent or emergent issues, a gastroenterologist can be reached at any hour by calling [phone]. Do not use MyChart messaging for urgent concerns.    DIET:  We do recommend a small meal at first, but then you may proceed to your regular diet.  Drink plenty of fluids but you should avoid alcoholic beverages for 24 hours.  ACTIVITY:  You should plan to take  it easy for the rest of today and you should NOT DRIVE or use heavy machinery until tomorrow (because of the sedation medicines used during the test).    FOLLOW UP: Our staff will call the number listed on your records 48-72 hours following your procedure to check on you and address any questions or concerns that you may have regarding the information given to you following your procedure. If we do not reach you, we will leave a message.  We will attempt to reach you two times.  During this call, we will ask if you have developed any symptoms of COVID 19. If you develop any symptoms (ie: fever, flu-like symptoms, shortness of breath, cough etc.) before then, please call [phone].  If you test positive for Covid 19 in the 2 weeks post procedure, please call and report this information to us.    If any biopsies were taken you will be contacted by phone or by letter within the next 1-3 weeks.  Please call us at [phone] if you have not heard about the biopsies in 3 weeks.   SIGNATURES/CONFIDENTIALITY: You and/or your care partner have signed paperwork which will be entered into your electronic medical record.  These signatures attest to the fact that that the information above on your After Visit Summary has been reviewed and is understood.  Full responsibility of the confidentiality of this discharge information lies with you and/or your care-partner. 

## 2020-07-12 NOTE — Op Note (Signed)
[facility] Patient Name: Hunter Graves Procedure Date: 07/12/2020 7:18 AM MRN: [id number] Endoscopist: Milus Banister , MD Age: 55 Referring MD:  Date of Birth: [birth year] Gender: Male Account #: [id number] Procedure:                Colonoscopy Indications:              Screening for colorectal malignant neoplasm Medicines:                Monitored Anesthesia Care Procedure:                Pre-Anesthesia Assessment:                           - Prior to the procedure, a History and Physical                            was performed, and patient medications and                            allergies were reviewed. The patient's tolerance of                            previous anesthesia was also reviewed. The risks                            and benefits of the procedure and the sedation                            options and risks were discussed with the patient.                            All questions were answered, and informed consent                            was obtained. Prior Anticoagulants: The patient has                            taken no previous anticoagulant or antiplatelet                            agents. ASA Grade Assessment: II - A patient with                            mild systemic disease. After reviewing the risks                            and benefits, the patient was deemed in                            satisfactory condition to undergo the procedure.                           After obtaining informed consent, the colonoscope  was passed under direct vision. Throughout the                            procedure, the patient's blood pressure, pulse, and                            oxygen saturations were monitored continuously. The                            Olympus CF-HQ190 [id number]) Colonoscope was                            introduced through the anus and advanced to the the                            cecum, identified by  appendiceal orifice and                            ileocecal valve. The colonoscopy was performed                            without difficulty. The patient tolerated the                            procedure well. The quality of the bowel                            preparation was good. The ileocecal valve,                            appendiceal orifice, and rectum were photographed. Scope In: 8:03:04 AM Scope Out: 8:13:12 AM Scope Withdrawal Time: 0 hours 8 minutes 24 seconds  Total Procedure Duration: 0 hours 10 minutes 8 seconds  Findings:                 A 3 mm polyp was found in the ascending colon. The                            polyp was sessile. The polyp was removed with a                            cold snare. Resection and retrieval were complete.                           The exam was otherwise without abnormality on                            direct and retroflexion views. Complications:            No immediate complications. Estimated blood loss:                            None. Estimated Blood Loss:     Estimated blood loss: none. Impression:               -  One 3 mm polyp in the ascending colon, removed                            with a cold snare. Resected and retrieved.                           - The examination was otherwise normal on direct                            and retroflexion views. Recommendation:           - Patient has a contact number available for                            emergencies. The signs and symptoms of potential                            delayed complications were discussed with the                            patient. Return to normal activities tomorrow.                            Written discharge instructions were provided to the                            patient.                           - Resume previous diet.                           - Continue present medications.                           - Await pathology results. Milus Banister, MD 07/12/2020 8:14:51 AM This report has been signed electronically.

## 2020-07-12 NOTE — Progress Notes (Signed)
Medical history reviewed with no changes since PV. VS assessed by C.W 

## 2020-07-12 NOTE — Progress Notes (Signed)
pt tolerated well. VSS. awake and to recovery. Report given to RN.  

## 2020-07-14 ENCOUNTER — Telehealth: Payer: Self-pay

## 2020-07-14 NOTE — Telephone Encounter (Signed)
  Follow up Call-  Call back number 07/12/2020  Post procedure Call Back phone  # [phone]  Permission to leave phone message Yes  Some recent data might be hidden     Patient questions:  Do you have a fever, pain , or abdominal swelling? No. Pain Score  0 *  Have you tolerated food without any problems? Yes.    Have you been able to return to your normal activities? Yes.    Do you have any questions about your discharge instructions: Diet   No. Medications  No. Follow up visit  No.  Do you have questions or concerns about your Care? No.  Actions: * If pain score is 4 or above: No action needed, pain <4.  1. Have you developed a fever since your procedure? no  2.   Have you had an respiratory symptoms (SOB or cough) since your procedure? no  3.   Have you tested positive for COVID 19 since your procedure no  4.   Have you had any family members/close contacts diagnosed with the COVID 19 since your procedure?  no   If yes to any of these questions please route to Joylene John, RN and Joella Prince, RN

## 2020-07-20 ENCOUNTER — Encounter: Payer: Self-pay | Admitting: Gastroenterology

## 2020-08-20 ENCOUNTER — Encounter: Payer: Self-pay | Admitting: Family Medicine

## 2020-09-22 ENCOUNTER — Encounter: Payer: Self-pay | Admitting: *Deleted

## 2020-11-01 ENCOUNTER — Other Ambulatory Visit: Payer: Self-pay | Admitting: Internal Medicine

## 2020-11-01 DIAGNOSIS — B20 Human immunodeficiency virus [HIV] disease: Secondary | ICD-10-CM

## 2020-11-10 ENCOUNTER — Ambulatory Visit: Payer: BC Managed Care – PPO | Admitting: Internal Medicine

## 2021-01-03 ENCOUNTER — Ambulatory Visit: Payer: BC Managed Care – PPO | Admitting: Internal Medicine

## 2021-01-24 ENCOUNTER — Ambulatory Visit: Payer: BC Managed Care – PPO | Admitting: Internal Medicine

## 2021-01-24 ENCOUNTER — Other Ambulatory Visit (HOSPITAL_COMMUNITY)
Admission: RE | Admit: 2021-01-24 | Discharge: 2021-01-24 | Disposition: A | Discharge status: Home / Self Care | Payer: BC Managed Care – PPO | Source: Ambulatory Visit | Attending: Internal Medicine | Admitting: Internal Medicine

## 2021-01-24 ENCOUNTER — Other Ambulatory Visit: Payer: Self-pay

## 2021-01-24 ENCOUNTER — Encounter: Payer: Self-pay | Admitting: Internal Medicine

## 2021-01-24 VITALS — BP 145/88 | HR 91 | Temp 98.0°F | Wt 175.0 lb

## 2021-01-24 DIAGNOSIS — I10 Essential (primary) hypertension: Secondary | ICD-10-CM | POA: Diagnosis not present

## 2021-01-24 DIAGNOSIS — B20 Human immunodeficiency virus [HIV] disease: Secondary | ICD-10-CM | POA: Insufficient documentation

## 2021-01-24 DIAGNOSIS — R7989 Other specified abnormal findings of blood chemistry: Secondary | ICD-10-CM

## 2021-01-24 DIAGNOSIS — Z23 Encounter for immunization: Secondary | ICD-10-CM

## 2021-01-24 NOTE — Progress Notes (Signed)
[facility] for Infectious Disease   CHIEF COMPLAINT    HIV follow up.    SUBJECTIVE:    Hunter Graves is a 55 y.o. male with PMHx as below who presents to the clinic for HIV follow up.   Pt previously followed with Dr Baxter Flattery but had an extended absence from clinic from Nov 2020 until being seen by me as a follow up on May 11, 2020.  His most recent labs from February showed CD4 284 and HIV RNA <20.  He continues on [place].   Please see A&P for the details of today's visit and status of the patient's medical problems.   Patient's Medications  New Prescriptions   No medications on file  Previous Medications   BIKTARVY 50-200-25 MG TABS TABLET    TAKE 1 TABLET BY MOUTH 1 TIME A DAY.   COVID-19 MRNA VAC-TRIS, PFIZER, SUSP INJECTION    INJECT AS DIRECTED   FERROUS SULFATE 325 (65 FE) MG TABLET    TAKE 1 TABLET BY MOUTH EVERY DAY WITH BREAKFAST   LEVOTHYROXINE (SYNTHROID) 50 MCG TABLET    TAKE 1 TABLET BY MOUTH EVERY DAY BEFORE BREAKFAST  Modified Medications   No medications on file  Discontinued Medications   No medications on file      Past Medical History:  Diagnosis Date   Allergy    pollen - mild    Chicken pox    HIV (human immunodeficiency virus infection) ([facility])    Seizures ([place])    "think I had my 1st today; they are still not sure" (08/21/2017)- last seizure 01-2019 per pt    Syncope and collapse    "think I had my 1st today; they are still not sure" (08/21/2017)   Thyroid disease     Social History   Tobacco Use   Smoking status: Never   Smokeless tobacco: Never  Vaping Use   Vaping Use: Never used  Substance Use Topics   Alcohol use: Not Currently    Comment: 08/21/2017 "1 drink q 6 months or less"   Drug use: Not Currently    Family History  Problem Relation Age of Onset   Arthritis Mother    Thyroid disease Mother    Hypertension Mother    Depression Mother    Arthritis Maternal Grandmother    Dementia Maternal Grandmother     Diabetes Maternal Grandfather    Hypertension Maternal Grandfather    Emphysema Maternal Grandfather    Cancer Maternal Aunt        breast   Breast cancer Maternal Aunt    Cancer Maternal Aunt 52       inflammatory breast cancer   Breast cancer Maternal Aunt    Cancer Maternal Aunt        breast cancer   Breast cancer Maternal Aunt    Cancer Cousin 40       breast   Breast cancer Cousin    Colon cancer Neg Hx    Colon polyps Neg Hx    Esophageal cancer Neg Hx    Rectal cancer Neg Hx    Stomach cancer Neg Hx     No Known Allergies  Review of Systems  Constitutional: Negative.   Respiratory: Negative.    Cardiovascular: Negative.   Gastrointestinal: Negative.     OBJECTIVE:    Vitals:   01/24/21 0831  BP: (!) 145/88  Pulse: 91  Temp: 98 F (36.7 C)  TempSrc: Oral  Weight: 175 lb (79.4 kg)     Body mass index is 25.11 kg/m.  Physical Exam Constitutional:      General: He is not in acute distress.    Appearance: Normal appearance.  Pulmonary:     Effort: Pulmonary effort is normal. No respiratory distress.  Neurological:     General: No focal deficit present.     Mental Status: He is alert and oriented to person, place, and time.  Psychiatric:        Mood and Affect: Mood normal.        Behavior: Behavior normal.    Labs and Microbiology: CMP Latest Ref Rng & Units 04/29/2020 05/28/2019 01/27/2019  Glucose 65 - 99 mg/dL 89 98 149(H)  BUN 7 - 25 mg/dL 16 16 16   Creatinine 0.70 - 1.33 mg/dL 1.82(H) 1.64(H) 1.82(H)  Sodium 135 - 146 mmol/L 139 137 135  Potassium 3.5 - 5.3 mmol/L 4.1 4.0 3.6  Chloride 98 - 110 mmol/L 104 101 103  CO2 20 - 32 mmol/L 24 28 17(L)  Calcium 8.6 - 10.3 mg/dL 9.4 9.9 9.3  Total Protein 6.1 - 8.1 g/dL 8.0 8.4(H) 8.1  Total Bilirubin 0.2 - 1.2 mg/dL 0.4 0.9 1.4(H)  Alkaline Phos 39 - 117 U/L - 70 56  AST 10 - 35 U/L 17 22 25   ALT 9 - 46 U/L 13 22 19    CBC Latest Ref Rng & Units 04/29/2020 05/28/2019 01/27/2019  WBC 3.8 - 10.8  Thousand/uL 6.4 6.0 15.8(H)  Hemoglobin 13.2 - 17.1 g/dL 16.5 15.6 12.6(L)  Hematocrit 38.5 - 50.0 % 48.1 46.6 39.9  Platelets 140 - 400 Thousand/uL 177 139.0(L) 177     Lab Results  Component Value Date   HIV1RNAQUANT <20 (H) 04/29/2020   HIV1RNAQUANT 63 (H) 01/27/2019   HIV1RNAQUANT 26 (H) 07/04/2018   CD4TABS 284 (L) 04/29/2020   CD4TABS 248 (L) 01/27/2019   CD4TABS 240 (L) 07/04/2018    RPR and STI: Lab Results  Component Value Date   LABRPR NON-REACTIVE 04/29/2020   LABRPR NON-REACTIVE 08/29/2017    STI Results GC CT  04/29/2020 Negative Negative    Hepatitis B: Lab Results  Component Value Date   HEPBSAB Non Reactive 08/24/2017   HEPBSAG Negative 08/24/2017   HEPBCAB Negative 08/24/2017   Hepatitis C: No results found for: HEPCAB, HCVRNAPCRQN Hepatitis A: Lab Results  Component Value Date   HAV NON-REACTIVE 08/29/2017   Lipids: Lab Results  Component Value Date   CHOL 148 04/29/2020   TRIG 135 04/29/2020   HDL 42 04/29/2020   CHOLHDL 3.5 04/29/2020   VLDL 21.2 05/28/2019   LDLCALC 83 04/29/2020       ASSESSMENT & PLAN:    HIV -- doing well on Biktarvy and will continue daily.  We will check labs today.  Vaccines -- Flu done today.  Declines covid booster.  STI/Screening -- Screening done.  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  Check today.  RTC 6 months  Orders Placed This Encounter  Procedures   Flu Vaccine QUAD 65mo+IM (Fluarix, Fluzone & Alfiuria Quad PF)     [facility] 01/24/2021, 8:48 AM

## 2021-01-25 LAB — URINALYSIS, ROUTINE W REFLEX MICROSCOPIC
Bilirubin Urine: NEGATIVE
Glucose, UA: NEGATIVE
Hgb urine dipstick: NEGATIVE
Ketones, ur: NEGATIVE
Leukocytes,Ua: NEGATIVE
Nitrite: NEGATIVE
Protein, ur: NEGATIVE
Specific Gravity, Urine: 1.007 (ref 1.001–1.035)
pH: 5.5 (ref 5.0–8.0)

## 2021-01-25 LAB — URINE CYTOLOGY ANCILLARY ONLY
Chlamydia: NEGATIVE
Comment: NEGATIVE
Comment: NORMAL
Neisseria Gonorrhea: NEGATIVE

## 2021-01-25 LAB — T-HELPER CELL (CD4) - (RCID CLINIC ONLY)
CD4 % Helper T Cell: 17 % — ABNORMAL LOW (ref 33–65)
CD4 T Cell Abs: 302 /uL — ABNORMAL LOW (ref 400–1790)

## 2021-01-27 LAB — COMPREHENSIVE METABOLIC PANEL
AG Ratio: 1.3 (calc) (ref 1.0–2.5)
ALT: 15 U/L (ref 9–46)
AST: 14 U/L (ref 10–35)
Albumin: 4.5 g/dL (ref 3.6–5.1)
Alkaline phosphatase (APISO): 71 U/L (ref 35–144)
BUN/Creatinine Ratio: 7 (calc) (ref 6–22)
BUN: 13 mg/dL (ref 7–25)
CO2: 29 mmol/L (ref 20–32)
Calcium: 9.4 mg/dL (ref 8.6–10.3)
Chloride: 103 mmol/L (ref 98–110)
Creat: 1.76 mg/dL — ABNORMAL HIGH (ref 0.70–1.30)
Globulin: 3.6 g/dL (calc) (ref 1.9–3.7)
Glucose, Bld: 96 mg/dL (ref 65–99)
Potassium: 4 mmol/L (ref 3.5–5.3)
Sodium: 139 mmol/L (ref 135–146)
Total Bilirubin: 0.3 mg/dL (ref 0.2–1.2)
Total Protein: 8.1 g/dL (ref 6.1–8.1)

## 2021-01-27 LAB — CBC
HCT: 48.5 % (ref 38.5–50.0)
Hemoglobin: 16.1 g/dL (ref 13.2–17.1)
MCH: 29.8 pg (ref 27.0–33.0)
MCHC: 33.2 g/dL (ref 32.0–36.0)
MCV: 89.6 fL (ref 80.0–100.0)
MPV: 12.4 fL (ref 7.5–12.5)
Platelets: 218 10*3/uL (ref 140–400)
RBC: 5.41 10*6/uL (ref 4.20–5.80)
RDW: 12.7 % (ref 11.0–15.0)
WBC: 7.4 10*3/uL (ref 3.8–10.8)

## 2021-01-27 LAB — HIV-1 RNA QUANT-NO REFLEX-BLD
HIV 1 RNA Quant: <20 Copies/mL — ABNORMAL HIGH
HIV-1 RNA Quant, Log: <1.3 Log cps/mL — ABNORMAL HIGH

## 2021-01-27 LAB — RPR: RPR Ser Ql: NONREACTIVE

## 2021-02-27 ENCOUNTER — Other Ambulatory Visit: Payer: Self-pay | Admitting: Family Medicine

## 2021-04-21 ENCOUNTER — Other Ambulatory Visit: Payer: Self-pay | Admitting: Internal Medicine

## 2021-04-21 DIAGNOSIS — B20 Human immunodeficiency virus [HIV] disease: Secondary | ICD-10-CM

## 2021-05-25 ENCOUNTER — Other Ambulatory Visit (HOSPITAL_COMMUNITY): Payer: Self-pay

## 2021-06-07 ENCOUNTER — Encounter: Payer: BC Managed Care – PPO | Admitting: Family Medicine

## 2021-06-07 ENCOUNTER — Ambulatory Visit (INDEPENDENT_AMBULATORY_CARE_PROVIDER_SITE_OTHER): Payer: BC Managed Care – PPO | Admitting: Family Medicine

## 2021-06-07 ENCOUNTER — Encounter: Payer: Self-pay | Admitting: Family Medicine

## 2021-06-07 VITALS — BP 128/72 | HR 84 | Temp 97.8°F | Resp 16 | Ht 70.0 in | Wt 176.0 lb

## 2021-06-07 DIAGNOSIS — Z125 Encounter for screening for malignant neoplasm of prostate: Secondary | ICD-10-CM

## 2021-06-07 DIAGNOSIS — B20 Human immunodeficiency virus [HIV] disease: Secondary | ICD-10-CM

## 2021-06-07 DIAGNOSIS — E038 Other specified hypothyroidism: Secondary | ICD-10-CM | POA: Diagnosis not present

## 2021-06-07 DIAGNOSIS — Z0000 Encounter for general adult medical examination without abnormal findings: Secondary | ICD-10-CM

## 2021-06-07 LAB — CBC WITH DIFFERENTIAL/PLATELET
Basophils Absolute: 0 10*3/uL (ref 0.0–0.1)
Basophils Relative: 0.5 % (ref 0.0–3.0)
Eosinophils Absolute: 0.3 10*3/uL (ref 0.0–0.7)
Eosinophils Relative: 3.8 % (ref 0.0–5.0)
HCT: 46.3 % (ref 39.0–52.0)
Hemoglobin: 15.4 g/dL (ref 13.0–17.0)
Lymphocytes Relative: 24 % (ref 12.0–46.0)
Lymphs Abs: 1.6 10*3/uL (ref 0.7–4.0)
MCHC: 33.2 g/dL (ref 30.0–36.0)
MCV: 90.3 fl (ref 78.0–100.0)
Monocytes Absolute: 0.5 10*3/uL (ref 0.1–1.0)
Monocytes Relative: 7.9 % (ref 3.0–12.0)
Neutro Abs: 4.2 10*3/uL (ref 1.4–7.7)
Neutrophils Relative %: 63.8 % (ref 43.0–77.0)
Platelets: 179 10*3/uL (ref 150.0–400.0)
RBC: 5.13 Mil/uL (ref 4.22–5.81)
RDW: 13.5 % (ref 11.5–15.5)
WBC: 6.6 10*3/uL (ref 4.0–10.5)

## 2021-06-07 LAB — BASIC METABOLIC PANEL WITH GFR
BUN: 19 mg/dL (ref 6–23)
CO2: 29 meq/L (ref 19–32)
Calcium: 9.6 mg/dL (ref 8.4–10.5)
Chloride: 102 meq/L (ref 96–112)
Creatinine, Ser: 1.76 mg/dL — ABNORMAL HIGH (ref 0.40–1.50)
GFR: 43.28 mL/min — ABNORMAL LOW
Glucose, Bld: 76 mg/dL (ref 70–99)
Potassium: 4 meq/L (ref 3.5–5.1)
Sodium: 139 meq/L (ref 135–145)

## 2021-06-07 LAB — LIPID PANEL
Cholesterol: 154 mg/dL (ref 0–200)
HDL: 39.1 mg/dL
LDL Cholesterol: 79 mg/dL (ref 0–99)
NonHDL: 114.92
Total CHOL/HDL Ratio: 4
Triglycerides: 178 mg/dL — ABNORMAL HIGH (ref 0.0–149.0)
VLDL: 35.6 mg/dL (ref 0.0–40.0)

## 2021-06-07 LAB — PSA: PSA: 0.47 ng/mL (ref 0.10–4.00)

## 2021-06-07 LAB — HEPATIC FUNCTION PANEL
ALT: 17 U/L (ref 0–53)
AST: 16 U/L (ref 0–37)
Albumin: 4.5 g/dL (ref 3.5–5.2)
Alkaline Phosphatase: 71 U/L (ref 39–117)
Bilirubin, Direct: 0.1 mg/dL (ref 0.0–0.3)
Total Bilirubin: 0.5 mg/dL (ref 0.2–1.2)
Total Protein: 7.7 g/dL (ref 6.0–8.3)

## 2021-06-07 LAB — TSH: TSH: 0.91 u[IU]/mL (ref 0.35–5.50)

## 2021-06-07 NOTE — Progress Notes (Signed)
? ?  Subjective:  ? ? Patient ID: Hunter Graves, male    DOB: Aug 12, 1965, 56 y.o.   MRN: [id number] ? ?HPI ?CPE- UTD on colonoscopy, Tdap, flu.  Considering Shingrix. ? ?Patient Care Team  ?  Relationship Specialty Notifications Start End  ?Midge Minium, MD PCP - General Family Medicine  08/23/17   ?Carlyle Basques, MD Consulting Physician Infectious Diseases  06/02/20   ?  ?Health Maintenance  ?Topic Date Due  ?? Zoster Vaccines- Shingrix (1 of 2) Never done  ?? COVID-19 Vaccine (4 - Booster for [place] series) 06/23/2021 (Originally 07/12/2020)  ?? COLONOSCOPY (Pts 45-66yr Insurance coverage will need to be confirmed)  07/13/2027  ?? TETANUS/TDAP  05/23/2028  ?? INFLUENZA VACCINE  Completed  ?? Hepatitis C Screening  Completed  ?? HIV Screening  Completed  ?? HPV VACCINES  Aged Out  ?  ? ? ?Review of Systems ?Patient reports no vision/hearing changes, anorexia, fever ,adenopathy, persistant/recurrent hoarseness, swallowing issues, chest pain, palpitations, edema, persistant/recurrent cough, hemoptysis, dyspnea (rest,exertional, paroxysmal nocturnal), gastrointestinal  bleeding (melena, rectal bleeding), abdominal pain, excessive heart burn, GU symptoms (dysuria, hematuria, voiding/incontinence issues) syncope, focal weakness, memory loss, numbness & tingling, skin/hair/nail changes, depression, anxiety, abnormal bruising/bleeding, musculoskeletal symptoms/signs.  ? ?This visit occurred during the SARS-CoV-2 public health emergency.  Safety protocols were in place, including screening questions prior to the visit, additional usage of staff PPE, and extensive cleaning of exam room while observing appropriate contact time as indicated for disinfecting solutions.   ?   ?Objective:  ? Physical Exam ?General Appearance:    Alert, cooperative, no distress, appears stated age  ?Head:    Normocephalic, without obvious abnormality, atraumatic  ?Eyes:    PERRL, conjunctiva/corneas clear, EOM's intact, fundi  ?  benign,  both eyes       ?Ears:    Normal TM's and external ear canals, both ears  ?Nose:   Nares normal, septum midline, mucosa normal, no drainage ?  or sinus tenderness  ?Throat:   Lips, mucosa, and tongue normal; teeth and gums normal  ?Neck:   Supple, symmetrical, trachea midline, no adenopathy;     ?  thyroid:  No enlargement/tenderness/nodules  ?Back:     Symmetric, no curvature, ROM normal, no CVA tenderness  ?Lungs:     Clear to auscultation bilaterally, respirations unlabored  ?Chest wall:    No tenderness or deformity  ?Heart:    Regular rate and rhythm, S1 and S2 normal, no murmur, rub ?  or gallop  ?Abdomen:     Soft, non-tender, bowel sounds active all four quadrants,  ?  no masses, no organomegaly  ?Genitalia:    Deferred   ?Rectal:    ?Extremities:   Extremities normal, atraumatic, no cyanosis or edema  ?Pulses:   2+ and symmetric all extremities  ?Skin:   Skin color, texture, turgor normal, no rashes or lesions  ?Lymph nodes:   Cervical, supraclavicular, and axillary nodes normal  ?Neurologic:   CNII-XII intact. Normal strength, sensation and reflexes    ?  throughout  ?  ? ? ? ?   ?Assessment & Plan:  ? ? ?

## 2021-06-07 NOTE — Assessment & Plan Note (Signed)
Following w/ ID.  Currently on Biktarvy ?

## 2021-06-07 NOTE — Assessment & Plan Note (Signed)
Pt's PE WNL.  UTD on colonoscopy, Tdap.  Was going to get shingles today but we don't have any in stock.  Check labs.  Anticipatory guidance provided.  ?

## 2021-06-07 NOTE — Patient Instructions (Addendum)
Follow up in 1 year or as needed ?We'll notify you of your lab results and make any changes if needed ?Keep up the good work on healthy diet and regular exercise- you look great! ?Call with any questions or concerns ?We'll do the Shingles shot when we get a new shipment ?Happy Spring!!! ?

## 2021-09-20 ENCOUNTER — Other Ambulatory Visit: Payer: Self-pay | Admitting: Internal Medicine

## 2021-09-20 DIAGNOSIS — B20 Human immunodeficiency virus [HIV] disease: Secondary | ICD-10-CM

## 2021-10-07 ENCOUNTER — Encounter: Payer: Self-pay | Admitting: Internal Medicine

## 2021-10-07 ENCOUNTER — Other Ambulatory Visit (HOSPITAL_COMMUNITY): Payer: Self-pay

## 2021-10-07 ENCOUNTER — Other Ambulatory Visit: Payer: Self-pay

## 2021-10-07 ENCOUNTER — Ambulatory Visit: Payer: BC Managed Care – PPO | Admitting: Internal Medicine

## 2021-10-07 VITALS — BP 143/85 | HR 84 | Temp 98.8°F | Ht 72.0 in | Wt 176.0 lb

## 2021-10-07 DIAGNOSIS — B20 Human immunodeficiency virus [HIV] disease: Secondary | ICD-10-CM

## 2021-10-07 DIAGNOSIS — R7989 Other specified abnormal findings of blood chemistry: Secondary | ICD-10-CM | POA: Diagnosis not present

## 2021-10-07 MED ORDER — BIKTARVY 50-200-25 MG PO TABS
ORAL_TABLET | ORAL | 11 refills | Status: DC
  Filled 2021-10-07: qty 30, fill #0

## 2021-10-07 NOTE — Progress Notes (Signed)
[facility] Infectious Disease   CHIEF COMPLAINT    HIV follow up.    SUBJECTIVE:    Hunter Graves is a 56 y.o. male with PMHx as below who presents to the clinic for HIV follow up.   Pt previously followed with Dr Baxter Flattery but had an extended absence from clinic from Nov 2020 until being seen by me as a follow up on May 11, 2020.  I saw him again on 01/24/21 and he was doing well on Biktarvy.  His viral load at that time was < 20.  He has no issues with Biktarvy.   Please see A&P for the details of today's visit and status of the patient's medical problems.   Patient's Medications  New Prescriptions   No medications on file  Previous Medications   FERROUS SULFATE 325 (65 FE) MG TABLET    TAKE 1 TABLET BY MOUTH EVERY DAY WITH BREAKFAST   LEVOTHYROXINE (SYNTHROID) 50 MCG TABLET    TAKE 1 TABLET BY MOUTH EVERY DAY BEFORE BREAKFAST  Modified Medications   Modified Medication Previous Medication   BICTEGRAVIR-EMTRICITABINE-TENOFOVIR AF (BIKTARVY) 50-200-25 MG TABS TABLET BIKTARVY 50-200-25 MG TABS tablet      TAKE 1 TABLET BY MOUTH 1 TIME A DAY.    TAKE 1 TABLET BY MOUTH 1 TIME A DAY.  Discontinued Medications   No medications on file      Past Medical History:  Diagnosis Date   Allergy    pollen - mild    Chicken pox    HIV (human immunodeficiency virus infection) ([place])    Seizures ([place])    "think I had my 1st today; they are still not sure" (08/21/2017)- last seizure 01-2019 per pt    Syncope and collapse    "think I had my 1st today; they are still not sure" (08/21/2017)   Thyroid disease     Social History   Tobacco Use   Smoking status: Never   Smokeless tobacco: Never  Vaping Use   Vaping Use: Never used  Substance Use Topics   Alcohol use: Yes    Comment: occasionally   Drug use: Not Currently    Family History  Problem Relation Age of Onset   Arthritis Mother    Thyroid disease Mother    Hypertension Mother    Depression Mother     Arthritis Maternal Grandmother    Dementia Maternal Grandmother    Diabetes Maternal Grandfather    Hypertension Maternal Grandfather    Emphysema Maternal Grandfather    Cancer Maternal Aunt        breast   Breast cancer Maternal Aunt    Cancer Maternal Aunt 52       inflammatory breast cancer   Breast cancer Maternal Aunt    Cancer Maternal Aunt        breast cancer   Breast cancer Maternal Aunt    Cancer Cousin 77       breast   Breast cancer Cousin    Colon cancer Neg Hx    Colon polyps Neg Hx    Esophageal cancer Neg Hx    Rectal cancer Neg Hx    Stomach cancer Neg Hx     No Known Allergies  Review of Systems  Constitutional: Negative.   Respiratory: Negative.    Cardiovascular: Negative.   Gastrointestinal: Negative.      OBJECTIVE:    Vitals:   10/07/21 1503  BP: (!) 143/85  Pulse: 84  Temp: 98.8 F (37.1 C)  TempSrc: Oral  SpO2: 97%  Weight: 176 lb (79.8 kg)  Height: 6' (1.829 m)     Body mass index is 23.87 kg/m.  Physical Exam Constitutional:      General: He is not in acute distress.    Appearance: Normal appearance.  Pulmonary:     Effort: Pulmonary effort is normal. No respiratory distress.  Neurological:     General: No focal deficit present.     Mental Status: He is alert and oriented to person, place, and time.  Psychiatric:        Mood and Affect: Mood normal.        Behavior: Behavior normal.     Labs and Microbiology:    Latest Ref Rng & Units 06/07/2021   11:17 AM 01/24/2021    8:53 AM 04/29/2020   10:18 AM  CMP  Glucose 70 - 99 mg/dL 76  96  89   BUN 6 - 23 mg/dL '19  13  16   '$ Creatinine 0.40 - 1.50 mg/dL 1.76  1.76  1.82   Sodium 135 - 145 mEq/L 139  139  139   Potassium 3.5 - 5.1 mEq/L 4.0  4.0  4.1   Chloride 96 - 112 mEq/L 102  103  104   CO2 19 - 32 mEq/L '29  29  24   '$ Calcium 8.4 - 10.5 mg/dL 9.6  9.4  9.4   Total Protein 6.0 - 8.3 g/dL 7.7  8.1  8.0   Total Bilirubin 0.2 - 1.2 mg/dL 0.5  0.3  0.4   Alkaline Phos  39 - 117 U/L 71     AST 0 - 37 U/L '16  14  17   '$ ALT 0 - 53 U/L '17  15  13       '$ Latest Ref Rng & Units 06/07/2021   11:17 AM 01/24/2021    8:53 AM 04/29/2020   10:18 AM  CBC  WBC 4.0 - 10.5 K/uL 6.6  7.4  6.4   Hemoglobin 13.0 - 17.0 g/dL 15.4  16.1  16.5   Hematocrit 39.0 - 52.0 % 46.3  48.5  48.1   Platelets 150.0 - 400.0 K/uL 179.0  218  177      Lab Results  Component Value Date   HIV1RNAQUANT <20 (H) 01/24/2021   HIV1RNAQUANT <20 (H) 04/29/2020   HIV1RNAQUANT 63 (H) 01/27/2019   CD4TABS 302 (L) 01/24/2021   CD4TABS 284 (L) 04/29/2020   CD4TABS 248 (L) 01/27/2019    RPR and STI: Lab Results  Component Value Date   LABRPR NON-REACTIVE 01/24/2021   LABRPR NON-REACTIVE 04/29/2020   LABRPR NON-REACTIVE 08/29/2017    STI Results GC CT  01/24/2021  5:54 AM Negative  Negative   04/29/2020 11:31 AM Negative  Negative     Hepatitis B: Lab Results  Component Value Date   HEPBSAB Non Reactive 08/24/2017   HEPBSAG Negative 08/24/2017   HEPBCAB Negative 08/24/2017   Hepatitis C: No results found for: "HEPCAB", "HCVRNAPCRQN" Hepatitis A: Lab Results  Component Value Date   HAV NON-REACTIVE 08/29/2017   Lipids: Lab Results  Component Value Date   CHOL 154 06/07/2021   TRIG 178.0 (H) 06/07/2021   HDL 39.10 06/07/2021   CHOLHDL 4 06/07/2021   VLDL 35.6 06/07/2021   LDLCALC 79 06/07/2021       ASSESSMENT & PLAN:    HIV -- doing well on Biktarvy and will continue daily.  We  will check labs today and I sent refills to [facility].  STI Screen -- patient dclines today  CKD3 -- Creatinine has been stable with labs done in March via his PCP.  RTC 12 months  Orders Placed This Encounter  Procedures   HIV-1 RNA quant-no reflex-bld   T-helper cell (CD4)- ([facility] clinic only)     Raynelle High[facility] for Infectious [facility] 10/07/2021, 3:30 PM

## 2021-10-10 ENCOUNTER — Other Ambulatory Visit: Payer: Self-pay

## 2021-10-10 ENCOUNTER — Other Ambulatory Visit (HOSPITAL_COMMUNITY): Payer: Self-pay

## 2021-10-10 DIAGNOSIS — B20 Human immunodeficiency virus [HIV] disease: Secondary | ICD-10-CM

## 2021-10-10 LAB — T-HELPER CELLS (CD4) COUNT (NOT AT ARMC)
Absolute CD4: 439 cells/uL — ABNORMAL LOW (ref 490–1740)
CD4 T Helper %: 20 % — ABNORMAL LOW (ref 30–61)
Total lymphocyte count: 2176 cells/uL (ref 850–3900)

## 2021-10-10 LAB — HIV-1 RNA QUANT-NO REFLEX-BLD
HIV 1 RNA Quant: NOT DETECTED Copies/mL
HIV-1 RNA Quant, Log: NOT DETECTED Log cps/mL

## 2021-10-10 MED ORDER — BIKTARVY 50-200-25 MG PO TABS: ORAL_TABLET | ORAL | 11 refills | Status: DC

## 2021-10-11 ENCOUNTER — Telehealth: Payer: Self-pay | Admitting: Family Medicine

## 2021-10-11 ENCOUNTER — Other Ambulatory Visit: Payer: Self-pay | Admitting: Family Medicine

## 2021-10-11 ENCOUNTER — Other Ambulatory Visit (HOSPITAL_COMMUNITY): Payer: Self-pay

## 2021-10-11 MED ORDER — LEVOTHYROXINE SODIUM 50 MCG PO TABS
ORAL_TABLET | ORAL | 2 refills | Status: DC
  Filled 2021-10-11 – 2021-10-31 (×3): qty 90, 90d supply, fill #0

## 2021-10-11 MED ORDER — FERROUS SULFATE 325 (65 FE) MG PO TABS
ORAL_TABLET | ORAL | 2 refills | Status: DC
  Filled 2021-10-11: qty 90, fill #0
  Filled 2021-10-20 – 2021-11-18 (×3): qty 90, 90d supply, fill #0

## 2021-10-20 ENCOUNTER — Other Ambulatory Visit (HOSPITAL_COMMUNITY): Payer: Self-pay

## 2021-10-31 ENCOUNTER — Encounter: Payer: Self-pay | Admitting: Family Medicine

## 2021-10-31 ENCOUNTER — Other Ambulatory Visit (HOSPITAL_COMMUNITY): Payer: Self-pay

## 2021-11-02 ENCOUNTER — Other Ambulatory Visit (HOSPITAL_COMMUNITY): Payer: Self-pay

## 2021-11-08 ENCOUNTER — Other Ambulatory Visit (HOSPITAL_COMMUNITY): Payer: Self-pay

## 2021-11-18 ENCOUNTER — Other Ambulatory Visit (HOSPITAL_COMMUNITY): Payer: Self-pay

## 2021-11-22 ENCOUNTER — Other Ambulatory Visit: Payer: Self-pay | Admitting: Family Medicine

## 2021-12-02 NOTE — Telephone Encounter (Signed)
error 

## 2022-03-16 ENCOUNTER — Other Ambulatory Visit: Payer: Self-pay

## 2022-03-16 DIAGNOSIS — B20 Human immunodeficiency virus [HIV] disease: Secondary | ICD-10-CM

## 2022-03-16 MED ORDER — BIKTARVY 50-200-25 MG PO TABS: ORAL_TABLET | ORAL | 1 refills | Status: DC

## 2022-06-09 ENCOUNTER — Ambulatory Visit (INDEPENDENT_AMBULATORY_CARE_PROVIDER_SITE_OTHER): Payer: BC Managed Care – PPO | Admitting: Family Medicine

## 2022-06-09 ENCOUNTER — Telehealth: Payer: Self-pay

## 2022-06-09 ENCOUNTER — Encounter: Payer: Self-pay | Admitting: Family Medicine

## 2022-06-09 VITALS — BP 138/84 | HR 85 | Temp 98.1°F | Resp 17 | Ht 72.0 in | Wt 168.5 lb

## 2022-06-09 DIAGNOSIS — E038 Other specified hypothyroidism: Secondary | ICD-10-CM

## 2022-06-09 DIAGNOSIS — Z125 Encounter for screening for malignant neoplasm of prostate: Secondary | ICD-10-CM

## 2022-06-09 DIAGNOSIS — R7989 Other specified abnormal findings of blood chemistry: Secondary | ICD-10-CM

## 2022-06-09 DIAGNOSIS — Z0000 Encounter for general adult medical examination without abnormal findings: Secondary | ICD-10-CM

## 2022-06-09 LAB — HEPATIC FUNCTION PANEL
ALT: 23 U/L (ref 0–53)
AST: 20 U/L (ref 0–37)
Albumin: 4.2 g/dL (ref 3.5–5.2)
Alkaline Phosphatase: 61 U/L (ref 39–117)
Bilirubin, Direct: 0.1 mg/dL (ref 0.0–0.3)
Total Bilirubin: 0.6 mg/dL (ref 0.2–1.2)
Total Protein: 7.8 g/dL (ref 6.0–8.3)

## 2022-06-09 LAB — CBC WITH DIFFERENTIAL/PLATELET
Basophils Absolute: 0 10*3/uL (ref 0.0–0.1)
Basophils Relative: 0.5 % (ref 0.0–3.0)
Eosinophils Absolute: 0.1 10*3/uL (ref 0.0–0.7)
Eosinophils Relative: 1.6 % (ref 0.0–5.0)
HCT: 46.6 % (ref 39.0–52.0)
Hemoglobin: 15.4 g/dL (ref 13.0–17.0)
Lymphocytes Relative: 27.9 % (ref 12.0–46.0)
Lymphs Abs: 1.8 10*3/uL (ref 0.7–4.0)
MCHC: 33 g/dL (ref 30.0–36.0)
MCV: 91 fl (ref 78.0–100.0)
Monocytes Absolute: 0.5 10*3/uL (ref 0.1–1.0)
Monocytes Relative: 7.7 % (ref 3.0–12.0)
Neutro Abs: 3.9 10*3/uL (ref 1.4–7.7)
Neutrophils Relative %: 62.3 % (ref 43.0–77.0)
Platelets: 182 10*3/uL (ref 150.0–400.0)
RBC: 5.12 Mil/uL (ref 4.22–5.81)
RDW: 13.4 % (ref 11.5–15.5)
WBC: 6.3 10*3/uL (ref 4.0–10.5)

## 2022-06-09 LAB — BASIC METABOLIC PANEL
BUN: 16 mg/dL (ref 6–23)
CO2: 30 mEq/L (ref 19–32)
Calcium: 9.5 mg/dL (ref 8.4–10.5)
Chloride: 102 mEq/L (ref 96–112)
Creatinine, Ser: 1.8 mg/dL — ABNORMAL HIGH (ref 0.40–1.50)
GFR: 41.84 mL/min — ABNORMAL LOW (ref >60.00)
Glucose, Bld: 98 mg/dL (ref 70–99)
Potassium: 4.6 mEq/L (ref 3.5–5.1)
Sodium: 139 mEq/L (ref 135–145)

## 2022-06-09 LAB — LIPID PANEL
Cholesterol: 163 mg/dL (ref 0–200)
HDL: 46.2 mg/dL (ref >39.00)
LDL Cholesterol: 96 mg/dL (ref 0–99)
NonHDL: 116.5
Total CHOL/HDL Ratio: 4
Triglycerides: 102 mg/dL (ref 0.0–149.0)
VLDL: 20.4 mg/dL (ref 0.0–40.0)

## 2022-06-09 LAB — PSA: PSA: 0.65 ng/mL (ref 0.10–4.00)

## 2022-06-09 LAB — TSH: TSH: 0.89 u[IU]/mL (ref 0.35–5.50)

## 2022-06-09 NOTE — Telephone Encounter (Signed)
-----   Message from Midge Minium, MD sent at 06/09/2022  3:41 PM EDT ----- Creatinine and GFR are stable and remainder of labs look good!  No changes at this time

## 2022-06-09 NOTE — Telephone Encounter (Signed)
Informed pt of lab results  

## 2022-06-09 NOTE — Assessment & Plan Note (Signed)
Pt's PE unchanged from previous and WNL w/ exception of dentition.  UTD on Tdap, colonoscopy.  Check labs.  Anticipatory guidance provided.

## 2022-06-09 NOTE — Telephone Encounter (Signed)
See note I have spoken to pt in regards to his labs ,

## 2022-06-09 NOTE — Assessment & Plan Note (Signed)
Pt currently asymptomatic.  Check labs.  Adjust meds prn

## 2022-06-09 NOTE — Assessment & Plan Note (Signed)
Check labs to trend

## 2022-06-09 NOTE — Patient Instructions (Signed)
Follow up in 1 year or as needed We'll notify you of your lab results and make any changes if needed Keep up the good work on healthy diet and regular exercise- you look great! Call with any questions or concerns Stay Safe!  Stay Healthy!! 

## 2022-06-09 NOTE — Progress Notes (Signed)
   Subjective:    Patient ID: Hunter Graves, male    DOB: 1966-03-22, 57 y.o.   MRN: [id number]  HPI CPE- UTD on colonoscopy, Tdap  Pt reports feeling well.  Patient Care Team    Relationship Specialty Notifications Start End  Midge Minium, MD PCP - General Family Medicine  08/23/17   Carlyle Basques, MD Consulting Physician Infectious Diseases  06/02/20      Health Maintenance  Topic Date Due   INFLUENZA VACCINE  06/25/2022 (Originally 10/25/2021)   Zoster Vaccines- Shingrix (1 of 2) 09/09/2022 (Originally 11/16/1985)   COLONOSCOPY (Pts 45-36yrs Insurance coverage will need to be confirmed)  07/13/2027   DTaP/Tdap/Td (2 - Td or Tdap) 05/23/2028   Hepatitis C Screening  Completed   HIV Screening  Completed   HPV VACCINES  Aged Out   COVID-19 Vaccine  Discontinued      Review of Systems Patient reports no vision/hearing changes, anorexia, fever ,adenopathy, persistant/recurrent hoarseness, swallowing issues, chest pain, palpitations, edema, persistant/recurrent cough, hemoptysis, dyspnea (rest,exertional, paroxysmal nocturnal), gastrointestinal  bleeding (melena, rectal bleeding), abdominal pain, excessive heart burn, GU symptoms (dysuria, hematuria, voiding/incontinence issues) syncope, focal weakness, memory loss, numbness & tingling, skin/hair/nail changes, depression, anxiety, abnormal bruising/bleeding, musculoskeletal symptoms/signs.   + 7 lb weight loss    Objective:   Physical Exam General Appearance:    Alert, cooperative, no distress, appears stated age  Head:    Normocephalic, without obvious abnormality, atraumatic  Eyes:    PERRL, conjunctiva/corneas clear, EOM's intact both eyes       Ears:    Normal TM's and external ear canals, both ears  Nose:   Nares normal, septum midline, mucosa normal, no drainage   or sinus tenderness  Throat:   Lips, mucosa, and tongue normal; poor dentition, gums normal  Neck:   Supple, symmetrical, trachea midline, no adenopathy;        thyroid:  No enlargement/tenderness/nodules  Back:     Symmetric, no curvature, ROM normal, no CVA tenderness  Lungs:     Clear to auscultation bilaterally, respirations unlabored  Chest wall:    No tenderness or deformity  Heart:    Regular rate and rhythm, S1 and S2 normal, no murmur, rub   or gallop  Abdomen:     Soft, non-tender, bowel sounds active all four quadrants,    no masses, no organomegaly  Genitalia:    deferred  Rectal:    Extremities:   Extremities normal, atraumatic, no cyanosis or edema  Pulses:   2+ and symmetric all extremities  Skin:   Skin color, texture, turgor normal, no rashes or lesions  Lymph nodes:   Cervical, supraclavicular, and axillary nodes normal  Neurologic:   CNII-XII intact. Normal strength, sensation and reflexes      throughout          Assessment & Plan:

## 2022-07-15 ENCOUNTER — Other Ambulatory Visit: Payer: Self-pay | Admitting: Family Medicine

## 2022-08-11 ENCOUNTER — Other Ambulatory Visit: Payer: Self-pay | Admitting: Family Medicine

## 2022-08-28 ENCOUNTER — Other Ambulatory Visit: Payer: Self-pay | Admitting: Internal Medicine

## 2022-08-28 DIAGNOSIS — B20 Human immunodeficiency virus [HIV] disease: Secondary | ICD-10-CM

## 2022-09-04 NOTE — Progress Notes (Signed)
The 10-year ASCVD risk score (Arnett DK, et al., 2019) is: 7.3%   Values used to calculate the score:     Age: 57 years     Sex: Male     Is Non-Hispanic African American: Yes     Diabetic: No     Tobacco smoker: No     Systolic Blood Pressure: 138 mmHg     Is BP treated: No     HDL Cholesterol: 46.2 mg/dL     Total Cholesterol: 163 mg/dL  Sandie Ano, RN

## 2022-09-15 ENCOUNTER — Other Ambulatory Visit: Payer: Self-pay

## 2022-09-15 ENCOUNTER — Encounter: Payer: Self-pay | Admitting: Family

## 2022-09-15 ENCOUNTER — Ambulatory Visit (INDEPENDENT_AMBULATORY_CARE_PROVIDER_SITE_OTHER): Payer: BC Managed Care – PPO | Admitting: Family

## 2022-09-15 VITALS — BP 123/83 | HR 84 | Resp 16 | Ht 72.0 in | Wt 169.0 lb

## 2022-09-15 DIAGNOSIS — B20 Human immunodeficiency virus [HIV] disease: Secondary | ICD-10-CM

## 2022-09-15 DIAGNOSIS — N1832 Chronic kidney disease, stage 3b: Secondary | ICD-10-CM

## 2022-09-15 DIAGNOSIS — Z0000 Encounter for general adult medical examination without abnormal findings: Secondary | ICD-10-CM

## 2022-09-15 DIAGNOSIS — Z113 Encounter for screening for infections with a predominantly sexual mode of transmission: Secondary | ICD-10-CM

## 2022-09-15 MED ORDER — BIKTARVY 50-200-25 MG PO TABS: 1.0000 | ORAL_TABLET | Freq: Every day | ORAL | 3 refills | Status: DC

## 2022-09-15 NOTE — Patient Instructions (Signed)
Nice to see you.  We will check your lab work today.  Continue to take your medication daily as prescribed.  Refills have been sent to the pharmacy.  Plan for follow up in 1 year or sooner if needed with lab work on the same day.  Have a great day and stay safe!  

## 2022-09-15 NOTE — Assessment & Plan Note (Signed)
Mr. Siedschlag continues to have well-controlled virus with good adherence and tolerance to [place].  Reviewed previous lab work and discussed U equals U and plan of care.  Continue current dose of Biktarvy.  Check blood work.  Phlebotomy aware of his need to lie down with blood draws.  Plan for follow-up in 1 year or sooner if needed with lab work on the same day.

## 2022-09-15 NOTE — Progress Notes (Signed)
Brief Narrative   Patient ID: Hunter Graves, male    DOB: 1966/01/17, 57 y.o.   MRN: [id number]  Hunter Graves is a 56 year old African-American male diagnosed with HIV-1 disease in June 2019 with initial CD4 count of 70 and viral load 463,000.  Genotype with no significant medication resistance mutations.  Entered care at [facility] stage III.  No history of opportunistic infection.  ART experienced with Biktarvy.  Subjective:    Chief Complaint  Patient presents with   Follow-up   HIV Positive/AIDS    HPI:  Hunter Graves is a 57 y.o. male with HIV disease last seen by Dr. Earlene Plater on 10/07/2021 with well-controlled virus and good adherence and tolerance to Biktarvy.  Viral load was undetectable with CD4 count 439.  Most recent renal function on 06/09/2022 with creatinine 1.8 and GFR 41.  Lipid profile with HDL 46, LDL 96, and triglycerides 841. ASCVD risk 7.3. Here today for follow up.   Hunter Graves has been doing well since his last office visit and continues to work as a Designer, industrial/product and is currently on medication until Monday.  Continues to take Biktarvy as prescribed with no adverse side effects or problems obtaining medication.  Feeling well with no new concerns/complaints.  Condoms and STD testing offered.  Healthcare maintenance due includes Menveo and Prevnar 20.  Due for routine dental care.  Denies fevers, chills, night sweats, headaches, changes in vision, neck pain/stiffness, nausea, diarrhea, vomiting, lesions or rashes.  No Known Allergies    Outpatient Medications Prior to Visit  Medication Sig Dispense Refill   ferrous sulfate 325 (65 FE) MG tablet TAKE 1 TABLET BY MOUTH EVERY DAY WITH BREAKFAST 90 tablet 2   levothyroxine (SYNTHROID) 50 MCG tablet TAKE 1 TABLET BY MOUTH EVERY DAY BEFORE BREAKFAST 90 tablet 2   bictegravir-emtricitabine-tenofovir AF (BIKTARVY) 50-200-25 MG TABS tablet TAKE 1 TABLET BY MOUTH 1 TIME A DAY 90 tablet 0   No facility-administered  medications prior to visit.     Past Medical History:  Diagnosis Date   Allergy    pollen - mild    Chicken pox    HIV (human immunodeficiency virus infection) ([facility])    Seizures ([facility])    "think I had my 1st today; they are still not sure" (08/21/2017)- last seizure 01-2019 per pt    Syncope and collapse    "think I had my 1st today; they are still not sure" (08/21/2017)   Thyroid disease      Past Surgical History:  Procedure Laterality Date   NO PAST SURGERIES        Review of Systems  Constitutional:  Negative for appetite change, chills, fatigue, fever and unexpected weight change.  Eyes:  Negative for visual disturbance.  Respiratory:  Negative for cough, chest tightness, shortness of breath and wheezing.   Cardiovascular:  Negative for chest pain and leg swelling.  Gastrointestinal:  Negative for abdominal pain, constipation, diarrhea, nausea and vomiting.  Genitourinary:  Negative for dysuria, flank pain, frequency, genital sores, hematuria and urgency.  Skin:  Negative for rash.  Allergic/Immunologic: Negative for immunocompromised state.  Neurological:  Negative for dizziness and headaches.      Objective:    BP 123/83   Pulse 84   Resp 16   Ht 6' (1.829 m)   Wt 169 lb (76.7 kg)   SpO2 100%   BMI 22.92 kg/m  Nursing note and vital signs reviewed.  Physical Exam Constitutional:  General: He is not in acute distress.    Appearance: He is well-developed.  Eyes:     Conjunctiva/sclera: Conjunctivae normal.  Cardiovascular:     Rate and Rhythm: Normal rate and regular rhythm.     Heart sounds: Normal heart sounds. No murmur heard.    No friction rub. No gallop.  Pulmonary:     Effort: Pulmonary effort is normal. No respiratory distress.     Breath sounds: Normal breath sounds. No wheezing or rales.  Chest:     Chest wall: No tenderness.  Abdominal:     General: Bowel sounds are normal.     Palpations: Abdomen is soft.     Tenderness: There is no  abdominal tenderness.  Musculoskeletal:     Cervical back: Neck supple.  Lymphadenopathy:     Cervical: No cervical adenopathy.  Skin:    General: Skin is warm and dry.     Findings: No rash.  Neurological:     Mental Status: He is alert and oriented to person, place, and time.  Psychiatric:        Behavior: Behavior normal.        Thought Content: Thought content normal.        Judgment: Judgment normal.         09/15/2022    8:36 AM 06/09/2022    9:58 AM 10/07/2021    3:07 PM 06/07/2021   10:43 AM 01/24/2021    8:33 AM  Depression screen PHQ 2/9  Decreased Interest 0 0 0 1 0  Down, Depressed, Hopeless 0 0 0 0 0  PHQ - 2 Score 0 0 0 1 0  Altered sleeping  0     Tired, decreased energy  0     Change in appetite  0     Feeling bad or failure about yourself   0     Trouble concentrating  0     Moving slowly or fidgety/restless  0     Suicidal thoughts  0     PHQ-9 Score  0     Difficult doing work/chores  Not difficult at all          Assessment & Plan:    Patient Active Problem List   Diagnosis Date Noted   Chronic kidney disease, stage 3b (HCC) 09/15/2022   Screening for STDs (sexually transmitted diseases) 09/15/2022   Healthcare maintenance 05/23/2018   Tinea capitis 05/23/2018   HIV disease (HCC)    Other specified hypothyroidism    Microcytic anemia    Elevated serum creatinine    Hypomagnesemia      Problem List Items Addressed This Visit       Genitourinary   Chronic kidney disease, stage 3b (HCC)    Hunter Graves most recent lab work shows renal function stable with creatinine of 1.8 and eGFR 41 and CrCl 50. Discussed need to continue for monitoring renal function. Safe to continue with current dose of Biktarvy.           Other   HIV disease [facility])    Hunter Graves continues to have well-controlled virus with good adherence and tolerance to [place].  Reviewed previous lab work and discussed U equals U and plan of care.  Continue current dose of  Biktarvy.  Check blood work.  Phlebotomy aware of his need to lie down with blood draws.  Plan for follow-up in 1 year or sooner if needed with lab work on the same day.  Relevant Medications   bictegravir-emtricitabine-tenofovir AF (BIKTARVY) 50-200-25 MG TABS tablet   Other Relevant Orders   BASIC METABOLIC PANEL WITH GFR   HIV-1 RNA quant-no reflex-bld   T-helper cells (CD4) count (not at [facility])   Healthcare maintenance    Discussed importance of safe sexual practice and condom use. Condoms and STD testing offered.  Colon cancer screening up to date. Discussed vaccines - declined for now. Due for Prevnar 20.  Due for routine dental care and will schedule independently. Can refer to [facility] if needed.       Screening for STDs (sexually transmitted diseases) - Primary   Relevant Orders   RPR     I have changed Hunter Graves's Biktarvy. I am also having him maintain his levothyroxine and ferrous sulfate.   Meds ordered this encounter  Medications   bictegravir-emtricitabine-tenofovir AF (BIKTARVY) 50-200-25 MG TABS tablet    Sig: Take 1 tablet by mouth daily.    Dispense:  90 tablet    Refill:  3    Order Specific Question:   Supervising Provider    Answer:   Judyann Munson [[id number]]     Follow-up: Return in about 1 year (around 09/15/2023), or if symptoms worsen or fail to improve.   Marcos Eke, MSN, FNP-C Nurse Practitioner [facility] for Infectious Disease [facility] Main number: [phone]

## 2022-09-15 NOTE — Assessment & Plan Note (Signed)
Discussed importance of safe sexual practice and condom use. Condoms and STD testing offered.  Colon cancer screening up to date. Discussed vaccines - declined for now. Due for Prevnar 20.  Due for routine dental care and will schedule independently. Can refer to [facility] if needed.

## 2022-09-15 NOTE — Assessment & Plan Note (Signed)
Hunter Graves most recent lab work shows renal function stable with creatinine of 1.8 and eGFR 41 and CrCl 50. Discussed need to continue for monitoring renal function. Safe to continue with current dose of Biktarvy.

## 2022-09-17 LAB — BASIC METABOLIC PANEL WITH GFR
BUN/Creatinine Ratio: 10 (calc) (ref 6–22)
BUN: 17 mg/dL (ref 7–25)
CO2: 30 mmol/L (ref 20–32)
Calcium: 9.7 mg/dL (ref 8.6–10.3)
Chloride: 102 mmol/L (ref 98–110)
Creat: 1.76 mg/dL — ABNORMAL HIGH (ref 0.70–1.30)
Glucose, Bld: 103 mg/dL — ABNORMAL HIGH (ref 65–99)
Potassium: 3.8 mmol/L (ref 3.5–5.3)
Sodium: 140 mmol/L (ref 135–146)
eGFR: 45 mL/min/{1.73_m2} — ABNORMAL LOW (ref >=60)

## 2022-09-17 LAB — HIV-1 RNA QUANT-NO REFLEX-BLD
HIV 1 RNA Quant: <20 Copies/mL — ABNORMAL HIGH
HIV-1 RNA Quant, Log: <1.3 Log cps/mL — ABNORMAL HIGH

## 2022-09-17 LAB — RPR: RPR Ser Ql: NONREACTIVE

## 2022-09-17 LAB — T-HELPER CELLS (CD4) COUNT (NOT AT ARMC)
Absolute CD4: 439 cells/uL — ABNORMAL LOW (ref 490–1740)
CD4 T Helper %: 23 % — ABNORMAL LOW (ref 30–61)
Total lymphocyte count: 1939 cells/uL (ref 850–3900)

## 2023-05-04 ENCOUNTER — Other Ambulatory Visit: Payer: Self-pay | Admitting: Family Medicine

## 2023-05-30 ENCOUNTER — Other Ambulatory Visit: Payer: Self-pay

## 2023-05-30 DIAGNOSIS — B20 Human immunodeficiency virus [HIV] disease: Secondary | ICD-10-CM

## 2023-05-30 MED ORDER — BIKTARVY 50-200-25 MG PO TABS
1.0000 | ORAL_TABLET | Freq: Every day | ORAL | 0 refills | Status: DC
Start: 2023-05-30 — End: 2023-09-14

## 2023-06-15 ENCOUNTER — Encounter: Payer: Self-pay | Admitting: Family Medicine

## 2023-06-15 ENCOUNTER — Ambulatory Visit (INDEPENDENT_AMBULATORY_CARE_PROVIDER_SITE_OTHER): Payer: BC Managed Care – PPO | Admitting: Family Medicine

## 2023-06-15 VITALS — BP 140/100 | HR 88 | Temp 98.1°F | Ht 72.0 in | Wt 160.0 lb

## 2023-06-15 DIAGNOSIS — E038 Other specified hypothyroidism: Secondary | ICD-10-CM

## 2023-06-15 DIAGNOSIS — Z125 Encounter for screening for malignant neoplasm of prostate: Secondary | ICD-10-CM | POA: Diagnosis not present

## 2023-06-15 DIAGNOSIS — Z0000 Encounter for general adult medical examination without abnormal findings: Secondary | ICD-10-CM | POA: Diagnosis not present

## 2023-06-15 LAB — CBC WITH DIFFERENTIAL/PLATELET
Basophils Absolute: 0 10*3/uL (ref 0.0–0.1)
Basophils Relative: 0.4 % (ref 0.0–3.0)
Eosinophils Absolute: 0.2 10*3/uL (ref 0.0–0.7)
Eosinophils Relative: 2.5 % (ref 0.0–5.0)
HCT: 45.2 % (ref 39.0–52.0)
Hemoglobin: 15.1 g/dL (ref 13.0–17.0)
Lymphocytes Relative: 18.9 % (ref 12.0–46.0)
Lymphs Abs: 1.7 10*3/uL (ref 0.7–4.0)
MCHC: 33.3 g/dL (ref 30.0–36.0)
MCV: 91.3 fl (ref 78.0–100.0)
Monocytes Absolute: 0.6 10*3/uL (ref 0.1–1.0)
Monocytes Relative: 6.6 % (ref 3.0–12.0)
Neutro Abs: 6.4 10*3/uL (ref 1.4–7.7)
Neutrophils Relative %: 71.6 % (ref 43.0–77.0)
Platelets: 203 10*3/uL (ref 150.0–400.0)
RBC: 4.95 Mil/uL (ref 4.22–5.81)
RDW: 13 % (ref 11.5–15.5)
WBC: 8.9 10*3/uL (ref 4.0–10.5)

## 2023-06-15 LAB — HEPATIC FUNCTION PANEL
ALT: 11 U/L (ref 0–53)
AST: 14 U/L (ref 0–37)
Albumin: 4.5 g/dL (ref 3.5–5.2)
Alkaline Phosphatase: 74 U/L (ref 39–117)
Bilirubin, Direct: 0.2 mg/dL (ref 0.0–0.3)
Total Bilirubin: 0.6 mg/dL (ref 0.2–1.2)
Total Protein: 8 g/dL (ref 6.0–8.3)

## 2023-06-15 LAB — LIPID PANEL
Cholesterol: 135 mg/dL (ref 0–200)
HDL: 42.1 mg/dL (ref >39.00)
LDL Cholesterol: 72 mg/dL (ref 0–99)
NonHDL: 93.19
Total CHOL/HDL Ratio: 3
Triglycerides: 107 mg/dL (ref 0.0–149.0)
VLDL: 21.4 mg/dL (ref 0.0–40.0)

## 2023-06-15 LAB — TSH: TSH: 1.01 u[IU]/mL (ref 0.35–5.50)

## 2023-06-15 LAB — BASIC METABOLIC PANEL
BUN: 14 mg/dL (ref 6–23)
CO2: 28 meq/L (ref 19–32)
Calcium: 9.6 mg/dL (ref 8.4–10.5)
Chloride: 103 meq/L (ref 96–112)
Creatinine, Ser: 1.62 mg/dL — ABNORMAL HIGH (ref 0.40–1.50)
GFR: 47.14 mL/min — ABNORMAL LOW (ref >60.00)
Glucose, Bld: 105 mg/dL — ABNORMAL HIGH (ref 70–99)
Potassium: 3.9 meq/L (ref 3.5–5.1)
Sodium: 139 meq/L (ref 135–145)

## 2023-06-15 LAB — PSA: PSA: 0.86 ng/mL (ref 0.10–4.00)

## 2023-06-15 NOTE — Patient Instructions (Addendum)
 Follow up in 1 year or as needed We'll notify you of your lab results and make any changes if needed Make sure you are eating regularly and drinking plenty of fluids Call with any questions or concerns Stay Safe!  Stay Healthy! Happy Spring!!

## 2023-06-15 NOTE — Assessment & Plan Note (Signed)
 Pt's PE WNL w/ exception of poor dentition.  UTD on colonoscopy, Tdap.  Check labs.  Anticipatory guidance provided.

## 2023-06-15 NOTE — Progress Notes (Signed)
   Subjective:    Patient ID: Hunter Graves, male    DOB: 1965-08-21, 58 y.o.   MRN: [id number]  HPI CPE- UTD on colonoscopy, UTD on Tdap.  Patient Care Team    Relationship Specialty Notifications Start End  Sheliah Hatch, MD PCP - General Family Medicine  08/23/17   Judyann Munson, MD Consulting Physician Infectious Diseases  06/02/20     Health Maintenance  Topic Date Due   Zoster Vaccines- Shingrix (1 of 2) Never done   INFLUENZA VACCINE  10/26/2022   Pneumococcal Vaccine 19-45 Years old (3 of 3 - PPSV23 or PCV20) 11/15/2022   Colonoscopy  07/13/2027   DTaP/Tdap/Td (2 - Td or Tdap) 05/23/2028   Hepatitis C Screening  Completed   HIV Screening  Completed   HPV VACCINES  Aged Out   COVID-19 Vaccine  Discontinued      Review of Systems Patient reports no vision/hearing changes, anorexia, fever ,adenopathy, persistant/recurrent hoarseness, swallowing issues, chest pain, palpitations, edema, persistant/recurrent cough, hemoptysis, dyspnea (rest,exertional, paroxysmal nocturnal), gastrointestinal  bleeding (melena, rectal bleeding), abdominal pain, excessive heart burn, GU symptoms (dysuria, hematuria, voiding/incontinence issues) syncope, focal weakness, memory loss, numbness & tingling, skin/hair/nail changes, depression, anxiety, abnormal bruising/bleeding, musculoskeletal symptoms/signs.   + 9 lb weight loss    Objective:   Physical Exam General Appearance:    Alert, cooperative, no distress, appears stated age  Head:    Normocephalic, without obvious abnormality, atraumatic  Eyes:    PERRL, conjunctiva/corneas clear, EOM's intact both eyes       Ears:    Normal TM's and external ear canals, both ears  Nose:   Nares normal, septum midline, mucosa normal, no drainage   or sinus tenderness  Throat:   Lips, mucosa, and tongue normal; gums normal, edentulous  Neck:   Supple, symmetrical, trachea midline, no adenopathy;       thyroid:  No enlargement/tenderness/nodules   Back:     Symmetric, no curvature, ROM normal, no CVA tenderness  Lungs:     Clear to auscultation bilaterally, respirations unlabored  Chest wall:    No tenderness or deformity  Heart:    Regular rate and rhythm, S1 and S2 normal, no murmur, rub   or gallop  Abdomen:     Soft, non-tender, bowel sounds active all four quadrants,    no masses, no organomegaly  Genitalia:    deferred  Rectal:    Extremities:   Extremities normal, atraumatic, no cyanosis or edema  Pulses:   2+ and symmetric all extremities  Skin:   Skin color, texture, turgor normal, no rashes or lesions  Lymph nodes:   Cervical, supraclavicular, and axillary nodes normal  Neurologic:   CNII-XII intact. Normal strength, sensation and reflexes      throughout          Assessment & Plan:

## 2023-06-16 ENCOUNTER — Encounter: Payer: Self-pay | Admitting: Family Medicine

## 2023-06-30 ENCOUNTER — Other Ambulatory Visit: Payer: Self-pay | Admitting: Family Medicine

## 2023-08-24 NOTE — Progress Notes (Signed)
 The 10-year ASCVD risk score (Arnett DK, et al., 2019) is: 7.6%   Values used to calculate the score:     Age: 58 years     Sex: Male     Is Non-Hispanic African American: Yes     Diabetic: No     Tobacco smoker: No     Systolic Blood Pressure: 140 mmHg     Is BP treated: No     HDL Cholesterol: 42.1 mg/dL     Total Cholesterol: 135 mg/dL  No current statin therapy, next appointment note updated.   Nakaila Freeze, BSN, RN

## 2023-09-14 ENCOUNTER — Ambulatory Visit (INDEPENDENT_AMBULATORY_CARE_PROVIDER_SITE_OTHER): Payer: BC Managed Care – PPO | Admitting: Family

## 2023-09-14 ENCOUNTER — Other Ambulatory Visit: Payer: Self-pay

## 2023-09-14 ENCOUNTER — Encounter: Payer: Self-pay | Admitting: Family

## 2023-09-14 ENCOUNTER — Other Ambulatory Visit (HOSPITAL_COMMUNITY): Payer: Self-pay

## 2023-09-14 VITALS — BP 146/89 | HR 97 | Temp 98.3°F | Wt 162.0 lb

## 2023-09-14 DIAGNOSIS — Z9189 Other specified personal risk factors, not elsewhere classified: Secondary | ICD-10-CM | POA: Diagnosis not present

## 2023-09-14 DIAGNOSIS — N1832 Chronic kidney disease, stage 3b: Secondary | ICD-10-CM

## 2023-09-14 DIAGNOSIS — Z79899 Other long term (current) drug therapy: Secondary | ICD-10-CM

## 2023-09-14 DIAGNOSIS — B20 Human immunodeficiency virus [HIV] disease: Secondary | ICD-10-CM

## 2023-09-14 DIAGNOSIS — Z0000 Encounter for general adult medical examination without abnormal findings: Secondary | ICD-10-CM | POA: Insufficient documentation

## 2023-09-14 MED ORDER — BIKTARVY 50-200-25 MG PO TABS: 1.0000 | ORAL_TABLET | Freq: Every day | ORAL | 1 refills | Status: DC

## 2023-09-14 NOTE — Assessment & Plan Note (Signed)
 Hunter Graves has a 10-year ASCVD risk of 8.2%.  Discussed REPRIEVE study recommendations to start statin therapy >= 5% to reduce risk of cardiovascular disease and HIV associated inflammation.  He will consider.

## 2023-09-14 NOTE — Assessment & Plan Note (Signed)
 Discussed importance of safe sexual practice and condom use. Condoms and site specific STD testing offered.  Vaccinations reviewed and deferred following counseling. Due for Prevnar 20 and Shingrix.  Colon cancer screening up to date Due for dental care which he will schedule independently.

## 2023-09-14 NOTE — Patient Instructions (Addendum)
 Nice to see you. ? ?We will check your lab work today. ? ?Continue to take your medication daily as prescribed. ? ?Refills have been sent to the pharmacy. ? ?Plan for follow up in 6 months or sooner if needed with lab work on the same day. ? ?Have a great day and stay safe! ? ?

## 2023-09-14 NOTE — Assessment & Plan Note (Signed)
 Mr. Goren continues to have well-controlled virus with good adherence and tolerance to Biktarvy .  Reviewed previous lab work and discussed plan of care and U equals U.  Covered by [employer].  Did have a $30 co-pay and co-pay card provided.  He will let us  know if continues to have additional issues.  Social determinants of health reviewed with no interventions indicated.  Continue current dose of Biktarvy .  Check lab work.  Plan for follow-up in 6 months or sooner if needed with lab work on the same day.

## 2023-09-14 NOTE — Progress Notes (Signed)
 Brief Narrative   Patient ID: Hunter Graves, male    DOB: 02/09/1966, 58 y.o.   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 Genotype with no significant medication resistance mutations. Entered care at [facility] stage III. No history of opportunistic infection. ART experienced with Biktarvy .   Subjective:   Chief Complaint  Patient presents with   Follow-up    B20    HPI:  Hunter Graves is a 58 y.o. male with HIV disease last seen on 09/15/2022 with well-controlled virus and good adherence and tolerance to Biktarvy .  Viral load was undetectable with CD4 count 439.  Liver function and electrolytes within normal ranges.  Renal function with creatinine 1.62 and estimated GFR of 47 consistent with CKD stage IIIa.  Here today for follow-up.  Mr. Wander has been doing well since his last office visit and continues to take Biktarvy  as prescribed and no adverse side effects or problems obtaining medication from pharmacy.  Has noted a $30 co-pay with most recent refill where he has never had to pay anything in the past.  Now covered by [employer].  Housing, transportation, and access to food are stable.  Healthcare maintenance reviewed.  Not currently sexually active.  Due for routine dental care.  Denies fevers, chills, night sweats, headaches, changes in vision, neck pain/stiffness, nausea, diarrhea, vomiting, lesions or rashes.  Lab Results  Component Value Date   CD4TCELL 23 (L) 09/15/2022   CD4TABS 302 (L) 01/24/2021   Lab Results  Component Value Date   HIV1RNAQUANT <20 (H) 09/15/2022     No Known Allergies    Outpatient Medications Prior to Visit  Medication Sig Dispense Refill   ferrous sulfate  325 (65 FE) MG tablet TAKE 1 TABLET BY MOUTH EVERY DAY WITH BREAKFAST 90 tablet 2   levothyroxine  (SYNTHROID ) 50 MCG tablet TAKE 1 TABLET BY MOUTH EVERY DAY BEFORE  BREAKFAST 90 tablet 2   bictegravir-emtricitabine -tenofovir  AF (BIKTARVY ) 50-200-25 MG TABS tablet Take 1 tablet by mouth daily. 90 tablet 0   No facility-administered medications prior to visit.     Past Medical History:  Diagnosis Date   Allergy    pollen - mild    Chicken pox    HIV (human immunodeficiency virus infection) (HCC)    Seizures (HCC)    think I had my 1st today; they are still not sure (08/21/2017)- last seizure 01-2019 per pt    Syncope and collapse    think I had my 1st today; they are still not sure (08/21/2017)   Thyroid  disease      Past Surgical History:  Procedure Laterality Date   NO PAST SURGERIES          Review of Systems  Constitutional:  Negative for appetite change, chills, fatigue, fever and unexpected weight change.  Eyes:  Negative for visual disturbance.  Respiratory:  Negative for cough, chest tightness, shortness of breath and wheezing.   Cardiovascular:  Negative for chest pain and leg swelling.  Gastrointestinal:  Negative for abdominal pain, constipation, diarrhea, nausea and vomiting.  Genitourinary:  Negative for dysuria, flank pain, frequency, genital sores, hematuria and urgency.  Skin:  Negative for rash.  Allergic/Immunologic: Negative for immunocompromised state.  Neurological:  Negative for dizziness and headaches.     Objective:   BP (!) 146/89   Pulse 97   Temp 98.3 F (36.8 C) (Oral)  Wt 162 lb (73.5 kg)   SpO2 97%   BMI 21.97 kg/m  Nursing note and vital signs reviewed.  Physical Exam Constitutional:      General: He is not in acute distress.    Appearance: He is well-developed.   Eyes:     Conjunctiva/sclera: Conjunctivae normal.    Cardiovascular:     Rate and Rhythm: Normal rate and regular rhythm.     Heart sounds: Normal heart sounds. No murmur heard.    No friction rub. No gallop.  Pulmonary:     Effort: Pulmonary effort is normal. No respiratory distress.     Breath sounds: Normal breath  sounds. No wheezing or rales.  Chest:     Chest wall: No tenderness.  Abdominal:     General: Bowel sounds are normal.     Palpations: Abdomen is soft.     Tenderness: There is no abdominal tenderness.   Musculoskeletal:     Cervical back: Neck supple.  Lymphadenopathy:     Cervical: No cervical adenopathy.   Skin:    General: Skin is warm and dry.     Findings: No rash.   Neurological:     Mental Status: He is alert and oriented to person, place, and time.   Psychiatric:        Behavior: Behavior normal.        Thought Content: Thought content normal.        Judgment: Judgment normal.          06/15/2023    9:38 AM 09/15/2022    8:36 AM 06/09/2022    9:58 AM 10/07/2021    3:07 PM 06/07/2021   10:43 AM  Depression screen PHQ 2/9  Decreased Interest 0 0 0 0 1  Down, Depressed, Hopeless 0 0 0 0 0  PHQ - 2 Score 0 0 0 0 1  Altered sleeping 0  0    Tired, decreased energy 0  0    Change in appetite 0  0    Feeling bad or failure about yourself  0  0    Trouble concentrating 0  0    Moving slowly or fidgety/restless 0  0    Suicidal thoughts 0  0    PHQ-9 Score 0  0    Difficult doing work/chores Not difficult at all  Not difficult at all          06/09/2022    9:59 AM  GAD 7 : Generalized Anxiety Score  Nervous, Anxious, on Edge 0  Control/stop worrying 0  Worry too much - different things 0  Trouble relaxing 0  Restless 0  Easily annoyed or irritable 0  Afraid - awful might happen 0  Total GAD 7 Score 0  Anxiety Difficulty Not difficult at all     The 10-year ASCVD risk score (Arnett DK, et al., 2019) is: 8.2%   Values used to calculate the score:     Age: 34 years     Clincally relevant sex: Male     Is Non-Hispanic African American: Yes     Diabetic: No     Tobacco smoker: No     Systolic Blood Pressure: 146 mmHg     Is BP treated: No     HDL Cholesterol: 42.1 mg/dL     Total Cholesterol: 135 mg/dL      Assessment & Plan:    Patient Active  Problem List   Diagnosis Date Noted   Healthcare maintenance 09/14/2023  At risk for cardiovascular event 09/14/2023   Chronic kidney disease, stage 3b (HCC) 09/15/2022   Screening for STDs (sexually transmitted diseases) 09/15/2022   Physical exam 05/23/2018   Tinea capitis 05/23/2018   HIV disease (HCC)    Other specified hypothyroidism    Microcytic anemia    Elevated serum creatinine    Hypomagnesemia      Problem List Items Addressed This Visit       Genitourinary   Chronic kidney disease, stage 3b [facility])   Mr. Rana has chronic kidney disease stage III with most recent lab work and we will check today.  Discussed close monitoring of renal function to ensure no significant changes.  Okay to continue Biktarvy  at this time and will consider adjustments to ART if needed.        Other   HIV disease (HCC) - Primary   Mr. Pappalardo continues to have well-controlled virus with good adherence and tolerance to Biktarvy .  Reviewed previous lab work and discussed plan of care and U equals U.  Covered by [employer].  Did have a $30 co-pay and co-pay card provided.  He will let us  know if continues to have additional issues.  Social determinants of health reviewed with no interventions indicated.  Continue current dose of Biktarvy .  Check lab work.  Plan for follow-up in 6 months or sooner if needed with lab work on the same day.      Relevant Medications   bictegravir-emtricitabine -tenofovir  AF (BIKTARVY ) 50-200-25 MG TABS tablet   Other Relevant Orders   Comprehensive metabolic panel with GFR   HIV-1 RNA quant-no reflex-bld   T-helper cells (CD4) count (not at [facility])   Healthcare maintenance   Discussed importance of safe sexual practice and condom use. Condoms and site specific STD testing offered.  Vaccinations reviewed and deferred following counseling. Due for Prevnar 20 and Shingrix.  Colon cancer screening up to date Due for dental care which he will schedule independently.        At risk for cardiovascular event   Mr. Swor has a 10-year ASCVD risk of 8.2%.  Discussed REPRIEVE study recommendations to start statin therapy >= 5% to reduce risk of cardiovascular disease and HIV associated inflammation.  He will consider.      Other Visit Diagnoses       Pharmacologic therapy       Relevant Orders   Lipid panel        I am having Jedadiah A. Hertenstein maintain his ferrous sulfate , levothyroxine , and Biktarvy .   Meds ordered this encounter  Medications   bictegravir-emtricitabine -tenofovir  AF (BIKTARVY ) 50-200-25 MG TABS tablet    Sig: Take 1 tablet by mouth daily.    Dispense:  90 tablet    Refill:  1    Supervising Provider:   Liane Redman [phone]    Prescription Type::   Renewal     Follow-up: Return in about 6 months (around 03/15/2024). or sooner if needed.    Marlan Silva, MSN, FNP-C Nurse Practitioner [facility] for Infectious Disease [facility] Main number: [phone]

## 2023-09-14 NOTE — Assessment & Plan Note (Signed)
 Hunter Graves has chronic kidney disease stage III with most recent lab work and we will check today.  Discussed close monitoring of renal function to ensure no significant changes.  Okay to continue Biktarvy  at this time and will consider adjustments to ART if needed.

## 2023-09-17 LAB — COMPREHENSIVE METABOLIC PANEL WITH GFR
AG Ratio: 1.4 (calc) (ref 1.0–2.5)
ALT: 15 U/L (ref 9–46)
AST: 13 U/L (ref 10–35)
Albumin: 4.3 g/dL (ref 3.6–5.1)
Alkaline phosphatase (APISO): 68 U/L (ref 35–144)
BUN/Creatinine Ratio: 12 (calc) (ref 6–22)
BUN: 23 mg/dL (ref 7–25)
CO2: 28 mmol/L (ref 20–32)
Calcium: 9.4 mg/dL (ref 8.6–10.3)
Chloride: 103 mmol/L (ref 98–110)
Creat: 1.88 mg/dL — ABNORMAL HIGH (ref 0.70–1.30)
Globulin: 3.1 g/dL (ref 1.9–3.7)
Glucose, Bld: 95 mg/dL (ref 65–99)
Potassium: 4 mmol/L (ref 3.5–5.3)
Sodium: 140 mmol/L (ref 135–146)
Total Bilirubin: 0.3 mg/dL (ref 0.2–1.2)
Total Protein: 7.4 g/dL (ref 6.1–8.1)
eGFR: 41 mL/min/{1.73_m2} — ABNORMAL LOW (ref >=60)

## 2023-09-17 LAB — LIPID PANEL
Cholesterol: 188 mg/dL (ref <200)
HDL: 41 mg/dL (ref >=40)
LDL Cholesterol (Calc): 118 mg/dL — ABNORMAL HIGH
Non-HDL Cholesterol (Calc): 147 mg/dL — ABNORMAL HIGH (ref <130)
Total CHOL/HDL Ratio: 4.6 (calc) (ref <5.0)
Triglycerides: 177 mg/dL — ABNORMAL HIGH (ref <150)

## 2023-09-17 LAB — T-HELPER CELLS (CD4) COUNT (NOT AT ARMC)
Absolute CD4: 314 {cells}/uL — ABNORMAL LOW (ref 490–1740)
CD4 T Helper %: 16 % — ABNORMAL LOW (ref 30–61)
Total lymphocyte count: 1931 {cells}/uL (ref 850–3900)

## 2023-09-17 LAB — HIV-1 RNA QUANT-NO REFLEX-BLD
HIV 1 RNA Quant: NOT DETECTED {copies}/mL
HIV-1 RNA Quant, Log: NOT DETECTED {Log_copies}/mL

## 2023-09-18 ENCOUNTER — Ambulatory Visit: Payer: Self-pay | Admitting: Family

## 2024-01-31 ENCOUNTER — Other Ambulatory Visit: Payer: Self-pay | Admitting: Family Medicine

## 2024-03-24 ENCOUNTER — Other Ambulatory Visit: Payer: Self-pay | Admitting: Family

## 2024-03-24 ENCOUNTER — Other Ambulatory Visit: Payer: Self-pay

## 2024-03-24 ENCOUNTER — Encounter: Payer: Self-pay | Admitting: Family

## 2024-03-24 ENCOUNTER — Ambulatory Visit: Admitting: Family

## 2024-03-24 VITALS — BP 179/99 | HR 89 | Temp 97.8°F | Ht 72.0 in | Wt 152.0 lb

## 2024-03-24 DIAGNOSIS — N1832 Chronic kidney disease, stage 3b: Secondary | ICD-10-CM

## 2024-03-24 DIAGNOSIS — Z9189 Other specified personal risk factors, not elsewhere classified: Secondary | ICD-10-CM | POA: Diagnosis not present

## 2024-03-24 DIAGNOSIS — I10 Essential (primary) hypertension: Secondary | ICD-10-CM | POA: Diagnosis not present

## 2024-03-24 DIAGNOSIS — B20 Human immunodeficiency virus [HIV] disease: Secondary | ICD-10-CM | POA: Diagnosis not present

## 2024-03-24 DIAGNOSIS — Z79899 Other long term (current) drug therapy: Secondary | ICD-10-CM | POA: Diagnosis not present

## 2024-03-24 DIAGNOSIS — Z0000 Encounter for general adult medical examination without abnormal findings: Secondary | ICD-10-CM

## 2024-03-24 MED ORDER — BIKTARVY 50-200-25 MG PO TABS: 1.0000 | ORAL_TABLET | Freq: Every day | ORAL | 1 refills | Status: AC

## 2024-03-24 NOTE — Patient Instructions (Signed)
 Nice to see you. ? ?We will check your lab work today. ? ?Continue to take your medication daily as prescribed. ? ?Refills have been sent to the pharmacy. ? ?Plan for follow up in 6 months or sooner if needed with lab work on the same day. ? ?Have a great day and stay safe! ? ?

## 2024-03-24 NOTE — Assessment & Plan Note (Signed)
 Not currently sexually active. Vaccinations reviewed and declined following counseling. Colon cancer screening up-to-date per recommendations. Due for routine dental care which he will schedule independently.

## 2024-03-24 NOTE — Assessment & Plan Note (Signed)
 Hunter Graves has had multiple elevated blood pressure readings indicating hypertension. Encouraged to monitor blood pressure at home and if mean blood pressure remains elevated recommend starting medication given co-morbid chronic kidney disease. Recommend follow up with Internal Medicine.

## 2024-03-24 NOTE — Progress Notes (Signed)
 "   Brief Narrative   Patient ID: Hunter Graves, male    DOB: 10-02-1965, 58 y.o.   MRN: [id number]  Hunter Graves is a 58 year old African-American male diagnosed with HIV-1 disease in June 2019 with initial CD4 count of 70 and viral load 463,000. Genotype with no significant medication resistance mutations. Entered care at [facility] stage III. No history of opportunistic infection. ART experienced with Biktarvy .   Subjective:   Chief Complaint  Patient presents with   Follow-up    B20    HPI:  Hunter Graves is a 58 y.o. male with HIV disease last seen on 09/14/2023 with well-controlled virus and good adherence and tolerance to Biktarvy .  Viral load was undetectable with CD4 count 314.  Kidney function with creatinine of 1.88 and estimated GFR 41.  Lipid profile with triglycerides 177, LDL 118, and HDL 41.  Here today for routine follow-up.  Hunter Graves has been doing well since his last office visit and continues to take Biktarvy  as prescribed with no adverse side effects or problems obtaining medication from the pharmacy.  Covered by [employer].  Working full-time with stable housing, transportation, and access to food.  No new concerns/complaints.  Not currently sexually active.  Due for routine dental care.  Healthcare maintenance reviewed.  Denies fevers, chills, night sweats, headaches, changes in vision, neck pain/stiffness, nausea, diarrhea, vomiting, lesions or rashes.  Lab Results  Component Value Date   CD4TCELL 16 (L) 09/14/2023   CD4TABS 302 (L) 01/24/2021   Lab Results  Component Value Date   HIV1RNAQUANT NOT DETECTED 09/14/2023     Allergies[1]    Outpatient Medications Prior to Visit  Medication Sig Dispense Refill   ferrous sulfate  325 (65 FE) MG tablet TAKE 1 TABLET BY MOUTH EVERY DAY WITH BREAKFAST 90 tablet 2   levothyroxine  (SYNTHROID ) 50 MCG tablet TAKE 1 TABLET BY MOUTH EVERY DAY BEFORE BREAKFAST 90 tablet 2   bictegravir-emtricitabine -tenofovir  AF  (BIKTARVY ) 50-200-25 MG TABS tablet Take 1 tablet by mouth daily. 90 tablet 1   No facility-administered medications prior to visit.     Past Medical History:  Diagnosis Date   Allergy    pollen - mild    Chicken pox    HIV (human immunodeficiency virus infection) (HCC)    Seizures (HCC)    think I had my 1st today; they are still not sure (08/21/2017)- last seizure 01-2019 per pt    Syncope and collapse    think I had my 1st today; they are still not sure (08/21/2017)   Thyroid  disease      Past Surgical History:  Procedure Laterality Date   NO PAST SURGERIES          Review of Systems  Constitutional:  Negative for appetite change, chills, fatigue, fever and unexpected weight change.  Eyes:  Negative for visual disturbance.  Respiratory:  Negative for cough, chest tightness, shortness of breath and wheezing.   Cardiovascular:  Negative for chest pain and leg swelling.  Gastrointestinal:  Negative for abdominal pain, constipation, diarrhea, nausea and vomiting.  Genitourinary:  Negative for dysuria, flank pain, frequency, genital sores, hematuria and urgency.  Skin:  Negative for rash.  Allergic/Immunologic: Negative for immunocompromised state.  Neurological:  Negative for dizziness and headaches.     Objective:   BP (!) 179/99   Pulse 89   Temp 97.8 F (36.6 C) (Oral)   Ht 6' (1.829 m)   Wt 152 lb (68.9 kg)   SpO2 97%  BMI 20.61 kg/m  Nursing note and vital signs reviewed.  Physical Exam Constitutional:      General: He is not in acute distress.    Appearance: He is well-developed.  Eyes:     Conjunctiva/sclera: Conjunctivae normal.  Cardiovascular:     Rate and Rhythm: Normal rate and regular rhythm.     Heart sounds: Normal heart sounds. No murmur heard.    No friction rub. No gallop.  Pulmonary:     Effort: Pulmonary effort is normal. No respiratory distress.     Breath sounds: Normal breath sounds. No wheezing or rales.  Chest:     Chest  wall: No tenderness.  Abdominal:     General: Bowel sounds are normal.     Palpations: Abdomen is soft.     Tenderness: There is no abdominal tenderness.  Musculoskeletal:     Cervical back: Neck supple.  Lymphadenopathy:     Cervical: No cervical adenopathy.  Skin:    General: Skin is warm and dry.     Findings: No rash.  Neurological:     Mental Status: He is alert and oriented to person, place, and time.          06/15/2023    9:38 AM 09/15/2022    8:36 AM 06/09/2022    9:58 AM 10/07/2021    3:07 PM 06/07/2021   10:43 AM  Depression screen PHQ 2/9  Decreased Interest 0 0 0 0 1  Down, Depressed, Hopeless 0 0 0 0 0  PHQ - 2 Score 0 0 0 0 1  Altered sleeping 0  0    Tired, decreased energy 0  0    Change in appetite 0  0    Feeling bad or failure about yourself  0  0    Trouble concentrating 0  0    Moving slowly or fidgety/restless 0  0    Suicidal thoughts 0  0    PHQ-9 Score 0   0     Difficult doing work/chores Not difficult at all  Not difficult at all       Data saved with a previous flowsheet row definition        06/09/2022    9:59 AM  GAD 7 : Generalized Anxiety Score  Nervous, Anxious, on Edge 0  Control/stop worrying 0  Worry too much - different things 0  Trouble relaxing 0  Restless 0  Easily annoyed or irritable 0  Afraid - awful might happen 0  Total GAD 7 Score 0  Anxiety Difficulty Not difficult at all     The 10-year ASCVD risk score (Arnett DK, et al., 2019) is: 13.4%   Values used to calculate the score:     Age: 53 years     Clinically relevant sex: Male     Is Non-Hispanic African American: Yes     Diabetic: No     Tobacco smoker: No     Systolic Blood Pressure: 179 mmHg     Is BP treated: No     HDL Cholesterol: 41 mg/dL     Total Cholesterol: 188 mg/dL      Assessment & Plan:    Patient Active Problem List   Diagnosis Date Noted   Hypertension 03/24/2024   Healthcare maintenance 09/14/2023   At risk for cardiovascular  event 09/14/2023   Chronic kidney disease, stage 3b (HCC) 09/15/2022   Screening for STDs (sexually transmitted diseases) 09/15/2022   Physical exam 05/23/2018   Tinea  capitis 05/23/2018   HIV disease (HCC)    Other specified hypothyroidism    Microcytic anemia    Elevated serum creatinine    Hypomagnesemia      Problem List Items Addressed This Visit       Cardiovascular and Mediastinum   Hypertension   Mr. Naomie has had multiple elevated blood pressure readings indicating hypertension. Encouraged to monitor blood pressure at home and if mean blood pressure remains elevated recommend starting medication given co-morbid chronic kidney disease. Recommend follow up with Internal Medicine.         Genitourinary   Chronic kidney disease, stage 3b (HCC) - Primary   Most recent lab work consistent with chronic kidney disease stage IIIb with estimated GFR of 41 with estimated creatinine clearance of 42 indicating safe to continue current dose of Biktarvy .  Will continue to monitor renal function while on Biktarvy .  Blood pressure elevated today a contributing factor and may need to consider medication.         Other   HIV disease [facility])   Mr. Demeo continues to have well-controlled virus with good adherence and tolerance to Biktarvy .  Reviewed previous lab work and discussed plan of care and U equals U.  Social determinants of health reviewed with no interventions indicated.  No problems obtaining medication from the pharmacy and covered by [facility].  Discussed plan of care to continue current dose of Biktarvy .  Check blood work.  Plan for follow-up in 6 months or sooner if needed with lab work on the same day.      Relevant Medications   bictegravir-emtricitabine -tenofovir  AF (BIKTARVY ) 50-200-25 MG TABS tablet   Other Relevant Orders   Comprehensive metabolic panel with GFR   HIV-1 RNA quant-no reflex-bld   T-helper cell (CD4)- ([facility] clinic only)   Healthcare maintenance   Not  currently sexually active. Vaccinations reviewed and declined following counseling. Colon cancer screening up-to-date per recommendations. Due for routine dental care which he will schedule independently.      At risk for cardiovascular event   Mr. Sluka ASCVD risk score increased to 13.5% and discussed recommendations for statin medication to reduce risk of cardiovascular disease and HIV associated inflammation. Requested time to consider following counseling.      Other Visit Diagnoses       Pharmacologic therapy       Relevant Orders   Lipid panel        I am having Osinachi A. Pizano maintain his levothyroxine , ferrous sulfate , and Biktarvy .   Meds ordered this encounter  Medications   bictegravir-emtricitabine -tenofovir  AF (BIKTARVY ) 50-200-25 MG TABS tablet    Sig: Take 1 tablet by mouth daily.    Dispense:  90 tablet    Refill:  1    Supervising Provider:   LUIZ CHANNEL [phone]    Prescription Type::   Renewal     Follow-up: Return in about 6 months (around 09/22/2024). or sooner if needed.    Cathlyn July, MSN, FNP-C Nurse Practitioner [facility] for Infectious Disease [facility] Main number: [phone]      [1] No Known Allergies  "

## 2024-03-24 NOTE — Assessment & Plan Note (Signed)
 Hunter Graves continues to have well-controlled virus with good adherence and tolerance to Biktarvy .  Reviewed previous lab work and discussed plan of care and U equals U.  Social determinants of health reviewed with no interventions indicated.  No problems obtaining medication from the pharmacy and covered by [facility].  Discussed plan of care to continue current dose of Biktarvy .  Check blood work.  Plan for follow-up in 6 months or sooner if needed with lab work on the same day.

## 2024-03-24 NOTE — Assessment & Plan Note (Addendum)
 Mr. Buchberger ASCVD risk score increased to 13.5% and discussed recommendations for statin medication to reduce risk of cardiovascular disease and HIV associated inflammation. Requested time to consider following counseling.

## 2024-03-24 NOTE — Assessment & Plan Note (Signed)
 Most recent lab work consistent with chronic kidney disease stage IIIb with estimated GFR of 41 with estimated creatinine clearance of 42 indicating safe to continue current dose of Biktarvy .  Will continue to monitor renal function while on Biktarvy .  Blood pressure elevated today a contributing factor and may need to consider medication.

## 2024-03-25 ENCOUNTER — Other Ambulatory Visit: Payer: Self-pay | Admitting: Family Medicine

## 2024-03-25 LAB — T-HELPER CELL (CD4) - (RCID CLINIC ONLY)
CD4 % Helper T Cell: 18 % — ABNORMAL LOW (ref 33–65)
CD4 T Cell Abs: 244 /uL — ABNORMAL LOW (ref 400–1790)

## 2024-03-26 LAB — COMPREHENSIVE METABOLIC PANEL WITH GFR
AG Ratio: 1.3 (calc) (ref 1.0–2.5)
ALT: 30 U/L (ref 9–46)
AST: 19 U/L (ref 10–35)
Albumin: 4.3 g/dL (ref 3.6–5.1)
Alkaline phosphatase (APISO): 74 U/L (ref 35–144)
BUN/Creatinine Ratio: 8 (calc) (ref 6–22)
BUN: 14 mg/dL (ref 7–25)
CO2: 30 mmol/L (ref 20–32)
Calcium: 9.2 mg/dL (ref 8.6–10.3)
Chloride: 104 mmol/L (ref 98–110)
Creat: 1.76 mg/dL — ABNORMAL HIGH (ref 0.70–1.30)
Globulin: 3.2 g/dL (ref 1.9–3.7)
Glucose, Bld: 101 mg/dL — ABNORMAL HIGH (ref 65–99)
Potassium: 3.8 mmol/L (ref 3.5–5.3)
Sodium: 140 mmol/L (ref 135–146)
Total Bilirubin: 0.5 mg/dL (ref 0.2–1.2)
Total Protein: 7.5 g/dL (ref 6.1–8.1)
eGFR: 45 mL/min/1.73m2 — ABNORMAL LOW

## 2024-03-26 LAB — LIPID PANEL
Cholesterol: 175 mg/dL
HDL: 49 mg/dL
LDL Cholesterol (Calc): 103 mg/dL — ABNORMAL HIGH
Non-HDL Cholesterol (Calc): 126 mg/dL
Total CHOL/HDL Ratio: 3.6 (calc)
Triglycerides: 129 mg/dL

## 2024-03-26 LAB — HIV-1 RNA QUANT-NO REFLEX-BLD
HIV 1 RNA Quant: <20 {copies}/mL — AB
HIV-1 RNA Quant, Log: <1.3 {Log_copies}/mL — AB

## 2024-03-31 ENCOUNTER — Ambulatory Visit: Payer: Self-pay | Admitting: Family

## 2024-06-20 ENCOUNTER — Encounter: Admitting: Family Medicine

## 2024-09-05 ENCOUNTER — Ambulatory Visit: Payer: Self-pay | Admitting: Family
